# Patient Record
Sex: Female | Born: 1940
Health system: Southern US, Community
[De-identification: ages and names within clinical notes are randomized; demographics above are authoritative.]

## PROBLEM LIST (undated history)

## (undated) DIAGNOSIS — R011 Cardiac murmur, unspecified: Secondary | ICD-10-CM

## (undated) DIAGNOSIS — I4891 Unspecified atrial fibrillation: Secondary | ICD-10-CM

## (undated) DIAGNOSIS — J45909 Unspecified asthma, uncomplicated: Secondary | ICD-10-CM

## (undated) DIAGNOSIS — E7800 Pure hypercholesterolemia, unspecified: Secondary | ICD-10-CM

## (undated) DIAGNOSIS — A0472 Enterocolitis due to Clostridium difficile, not specified as recurrent: Secondary | ICD-10-CM

## (undated) DIAGNOSIS — F419 Anxiety disorder, unspecified: Secondary | ICD-10-CM

## (undated) DIAGNOSIS — M810 Age-related osteoporosis without current pathological fracture: Secondary | ICD-10-CM

## (undated) HISTORY — DX: Cardiac murmur, unspecified: R01.1

## (undated) HISTORY — DX: Age-related osteoporosis without current pathological fracture: M81.0

## (undated) HISTORY — PX: NASAL SINUS SURGERY: SHX719

## (undated) HISTORY — PX: ABDOMINAL HYSTERECTOMY: SHX81

## (undated) HISTORY — DX: Enterocolitis due to Clostridium difficile, not specified as recurrent: A04.72

---

## 2003-10-05 LAB — HM COLONOSCOPY

## 2006-08-29 ENCOUNTER — Other Ambulatory Visit: Payer: Self-pay

## 2006-08-29 ENCOUNTER — Emergency Department: Payer: Self-pay | Admitting: General Practice

## 2006-09-10 ENCOUNTER — Ambulatory Visit: Payer: Self-pay | Admitting: General Practice

## 2007-03-05 ENCOUNTER — Ambulatory Visit: Payer: Self-pay | Admitting: Otolaryngology

## 2008-08-24 ENCOUNTER — Ambulatory Visit: Payer: Self-pay | Admitting: Family Medicine

## 2008-10-20 ENCOUNTER — Inpatient Hospital Stay: Payer: Self-pay | Admitting: Internal Medicine

## 2008-10-20 ENCOUNTER — Ambulatory Visit: Payer: Self-pay | Admitting: Family Medicine

## 2009-08-25 ENCOUNTER — Ambulatory Visit: Payer: Self-pay | Admitting: Family Medicine

## 2010-09-14 ENCOUNTER — Ambulatory Visit: Payer: Self-pay | Admitting: Family Medicine

## 2010-09-28 ENCOUNTER — Ambulatory Visit: Payer: Self-pay | Admitting: Family Medicine

## 2012-01-01 ENCOUNTER — Ambulatory Visit: Payer: Self-pay | Admitting: Family Medicine

## 2012-06-30 DIAGNOSIS — I4891 Unspecified atrial fibrillation: Secondary | ICD-10-CM | POA: Insufficient documentation

## 2012-06-30 DIAGNOSIS — J45909 Unspecified asthma, uncomplicated: Secondary | ICD-10-CM | POA: Insufficient documentation

## 2012-12-19 ENCOUNTER — Ambulatory Visit: Payer: Self-pay | Admitting: Family Medicine

## 2013-01-27 ENCOUNTER — Ambulatory Visit: Payer: Self-pay | Admitting: Family Medicine

## 2014-01-11 ENCOUNTER — Ambulatory Visit: Payer: Self-pay | Admitting: Oncology

## 2014-01-18 ENCOUNTER — Ambulatory Visit: Payer: Self-pay | Admitting: Oncology

## 2014-01-18 LAB — CBC CANCER CENTER
Basophil #: 0 x10 3/mm (ref 0.0–0.1)
Basophil %: 0.4 %
EOS PCT: 1.5 %
Eosinophil #: 0.1 x10 3/mm (ref 0.0–0.7)
HCT: 40.7 % (ref 35.0–47.0)
HGB: 13.5 g/dL (ref 12.0–16.0)
Lymphocyte #: 1.5 x10 3/mm (ref 1.0–3.6)
Lymphocyte %: 39.9 %
MCH: 32.3 pg (ref 26.0–34.0)
MCHC: 33.2 g/dL (ref 32.0–36.0)
MCV: 97 fL (ref 80–100)
MONO ABS: 0.4 x10 3/mm (ref 0.2–0.9)
Monocyte %: 10.5 %
NEUTROS ABS: 1.8 x10 3/mm (ref 1.4–6.5)
Neutrophil %: 47.7 %
Platelet: 185 x10 3/mm (ref 150–440)
RBC: 4.2 10*6/uL (ref 3.80–5.20)
RDW: 13.2 % (ref 11.5–14.5)
WBC: 3.8 x10 3/mm (ref 3.6–11.0)

## 2014-01-18 LAB — LACTATE DEHYDROGENASE: LDH: 174 U/L (ref 81–246)

## 2014-02-02 ENCOUNTER — Ambulatory Visit: Payer: Self-pay | Admitting: Oncology

## 2014-02-25 DIAGNOSIS — E785 Hyperlipidemia, unspecified: Secondary | ICD-10-CM | POA: Insufficient documentation

## 2014-02-25 DIAGNOSIS — F419 Anxiety disorder, unspecified: Secondary | ICD-10-CM | POA: Insufficient documentation

## 2014-02-25 DIAGNOSIS — I491 Atrial premature depolarization: Secondary | ICD-10-CM | POA: Insufficient documentation

## 2014-03-04 ENCOUNTER — Ambulatory Visit: Payer: Self-pay | Admitting: Oncology

## 2014-03-25 ENCOUNTER — Ambulatory Visit: Payer: Self-pay | Admitting: Family Medicine

## 2014-03-25 LAB — HM MAMMOGRAPHY

## 2014-07-22 ENCOUNTER — Ambulatory Visit: Payer: Self-pay | Admitting: Oncology

## 2014-07-26 ENCOUNTER — Ambulatory Visit: Payer: Self-pay

## 2014-08-03 ENCOUNTER — Ambulatory Visit
Admit: 2014-08-03 | Disposition: A | Discharge status: Home / Self Care | Payer: Self-pay | Attending: Oncology | Admitting: Oncology

## 2014-09-03 ENCOUNTER — Ambulatory Visit
Admit: 2014-09-03 | Disposition: A | Discharge status: Home / Self Care | Payer: Self-pay | Attending: Oncology | Admitting: Oncology

## 2014-10-19 ENCOUNTER — Emergency Department: Payer: Medicare PPO

## 2014-10-19 ENCOUNTER — Emergency Department
Admission: EM | Admit: 2014-10-19 | Discharge: 2014-10-19 | Disposition: A | Discharge status: Home / Self Care | Payer: Medicare PPO | Attending: Emergency Medicine | Admitting: Emergency Medicine

## 2014-10-19 ENCOUNTER — Encounter: Payer: Self-pay | Admitting: Emergency Medicine

## 2014-10-19 ENCOUNTER — Other Ambulatory Visit: Payer: Self-pay

## 2014-10-19 DIAGNOSIS — Z7951 Long term (current) use of inhaled steroids: Secondary | ICD-10-CM | POA: Diagnosis not present

## 2014-10-19 DIAGNOSIS — Z79899 Other long term (current) drug therapy: Secondary | ICD-10-CM | POA: Insufficient documentation

## 2014-10-19 DIAGNOSIS — R002 Palpitations: Secondary | ICD-10-CM

## 2014-10-19 DIAGNOSIS — R42 Dizziness and giddiness: Secondary | ICD-10-CM | POA: Diagnosis not present

## 2014-10-19 DIAGNOSIS — Z7982 Long term (current) use of aspirin: Secondary | ICD-10-CM | POA: Diagnosis not present

## 2014-10-19 DIAGNOSIS — Z7901 Long term (current) use of anticoagulants: Secondary | ICD-10-CM | POA: Diagnosis not present

## 2014-10-19 DIAGNOSIS — I499 Cardiac arrhythmia, unspecified: Secondary | ICD-10-CM | POA: Diagnosis present

## 2014-10-19 DIAGNOSIS — I4892 Unspecified atrial flutter: Secondary | ICD-10-CM | POA: Diagnosis not present

## 2014-10-19 HISTORY — DX: Unspecified asthma, uncomplicated: J45.909

## 2014-10-19 HISTORY — DX: Anxiety disorder, unspecified: F41.9

## 2014-10-19 HISTORY — DX: Pure hypercholesterolemia, unspecified: E78.00

## 2014-10-19 HISTORY — DX: Unspecified atrial fibrillation: I48.91

## 2014-10-19 LAB — URINALYSIS COMPLETE WITH MICROSCOPIC (ARMC ONLY)
BILIRUBIN URINE: NEGATIVE
Bacteria, UA: NONE SEEN
Glucose, UA: NEGATIVE mg/dL
Hgb urine dipstick: NEGATIVE
KETONES UR: NEGATIVE mg/dL
Leukocytes, UA: NEGATIVE
Nitrite: NEGATIVE
PH: 7 (ref 5.0–8.0)
Protein, ur: NEGATIVE mg/dL
RBC / HPF: NONE SEEN RBC/hpf (ref 0–5)
Specific Gravity, Urine: 1.003 — ABNORMAL LOW (ref 1.005–1.030)

## 2014-10-19 LAB — BASIC METABOLIC PANEL
Anion gap: 8 (ref 5–15)
BUN: 12 mg/dL (ref 6–20)
CALCIUM: 9.5 mg/dL (ref 8.9–10.3)
CO2: 29 mmol/L (ref 22–32)
Chloride: 103 mmol/L (ref 101–111)
Creatinine, Ser: 0.67 mg/dL (ref 0.44–1.00)
GFR calc non Af Amer: >60 mL/min (ref >60)
Glucose, Bld: 86 mg/dL (ref 65–99)
POTASSIUM: 3.9 mmol/L (ref 3.5–5.1)
Sodium: 140 mmol/L (ref 135–145)

## 2014-10-19 LAB — CBC
HEMATOCRIT: 42.4 % (ref 35.0–47.0)
Hemoglobin: 14.3 g/dL (ref 12.0–16.0)
MCH: 32.3 pg (ref 26.0–34.0)
MCHC: 33.7 g/dL (ref 32.0–36.0)
MCV: 95.8 fL (ref 80.0–100.0)
Platelets: 174 10*3/uL (ref 150–440)
RBC: 4.43 MIL/uL (ref 3.80–5.20)
RDW: 13.2 % (ref 11.5–14.5)
WBC: 4 10*3/uL (ref 3.6–11.0)

## 2014-10-19 LAB — TROPONIN I

## 2014-10-19 MED ORDER — METOPROLOL TARTRATE 1 MG/ML IV SOLN
2.5000 mg | Freq: Once | INTRAVENOUS | Status: AC
  Administered 2014-10-19: 2.5 mg via INTRAVENOUS

## 2014-10-19 MED ORDER — METOPROLOL TARTRATE 25 MG PO TABS
ORAL_TABLET | ORAL | Status: AC
  Administered 2014-10-19: 25 mg via ORAL
  Filled 2014-10-19: qty 1

## 2014-10-19 MED ORDER — METOPROLOL TARTRATE 25 MG PO TABS
25.0000 mg | ORAL_TABLET | Freq: Once | ORAL | Status: AC
  Administered 2014-10-19: 25 mg via ORAL

## 2014-10-19 MED ORDER — RIVAROXABAN (XARELTO) VTE STARTER PACK (15 & 20 MG): ORAL_TABLET | ORAL | Status: DC

## 2014-10-19 MED ORDER — METOPROLOL TARTRATE 1 MG/ML IV SOLN
INTRAVENOUS | Status: AC
  Filled 2014-10-19: qty 5

## 2014-10-19 NOTE — Discharge Instructions (Signed)
Take Xarelto for anticoagulation. Take metoprolol twice a day for rate control. Follow-up with Dr. Darrold JunkerParaschos. Return to the emergency department if you have any chest pain, weakness, or other urgent concerns.  Atrial Flutter Atrial flutter is a heart rhythm that can cause the heart to beat very fast (tachycardia). It originates in the upper chambers of the heart (atria). In atrial flutter, the top chambers of the heart (atria) often beat much faster than the bottom chambers of the heart (ventricles). Atrial flutter has a regular "saw toothed" appearance in an EKG readout. An EKG is a test that records the electrical activity of the heart. Atrial flutter can cause the heart to beat up to 150 beats per minute (BPM). Atrial flutter can either be short lived (paroxysmal) or permanent.  CAUSES  Causes of atrial flutter can be many. Some of these include:  Heart related issues:  Heart attack (myocardial infarction).  Heart failure.  Heart valve problems.  Poorly controlled high blood pressure (hypertension).  After open heart surgery.  Lung related issues:  A blood clot in the lungs (pulmonary embolism).  Chronic obstructive pulmonary disease (COPD). Medications used to treat COPD can attribute to atrial flutter.  Other related causes:  Hyperthyroidism.  Caffeine.  Some decongestant cold medications.  Low electrolyte levels such as potassium or magnesium.  Cocaine. SYMPTOMS  An awareness of your heart beating rapidly (palpitations).  Shortness of breath.  Chest pain.  Low blood pressure (hypotension).  Dizziness or fainting. DIAGNOSIS  Different tests can be performed to diagnose atrial flutter.   An EKG.  Holter monitor. This is a 24-hour recording of your heart rhythm. You will also be given a diary. Write down all symptoms that you have and what you were doing at the time you experienced symptoms.  Cardiac event monitor. This small device can be worn for up to 30  days. When you have heart symptoms, you will push a button on the device. This will then record your heart rhythm.  Echocardiogram. This is an imaging test to look at your heart. Your caregiver will look at your heart valves and the ventricles.  Stress test. This test can help determine if the atrial flutter is related to exercise or if coronary artery disease is present.  Laboratory studies will look at certain blood levels like:  Complete blood count (CBC).  Potassium.  Magnesium.  Thyroid function. TREATMENT  Treatment of atrial flutter varies. A combination of therapies may be used or sometimes atrial flutter may need only 1 type of treatment.  Lab work: If your blood work, such as your electrolytes (potassium, magnesium) or your thyroid function tests, are abnormal, your caregiver will treat them accordingly.  Medication:  There are several different types of medications that can convert your heart to a normal rhythm and prevent atrial flutter from reoccurring.  Nonsurgical procedures: Nonsurgical techniques may be used to control atrial flutter. Some examples include:  Cardioversion. This technique uses either drugs or an electrical shock to restore a normal heart rhythm:  Cardioversion drugs may be given through an intravenous (IV) line to help "reset" the heart rhythm.  In electrical cardioversion, your caregiver shocks your heart with electrical energy. This helps to reset the heartbeat to a normal rhythm.  Ablation. If atrial flutter is a persistent problem, an ablation may be needed. This procedure is done under mild sedation. High frequency radio-wave energy is used to destroy the area of heart tissue responsible for atrial flutter. SEEK IMMEDIATE MEDICAL CARE IF:  You have:  Dizziness.  Near fainting or fainting.  Shortness of breath.  Chest pain or pressure.  Sudden nausea or vomiting.  Profuse sweating. If you have the above symptoms, call your local  emergency service immediately! Do not drive yourself to the hospital. MAKE SURE YOU:   Understand these instructions.  Will watch your condition.  Will get help right away if you are not doing well or get worse. Document Released: 10/07/2008 Document Revised: 10/05/2013 Document Reviewed: 10/07/2008 [facility] Patient Information 2015 [place], [place]. This information is not intended to replace advice given to you by your health care provider. Make sure you discuss any questions you have with your health care provider.

## 2014-10-19 NOTE — ED Notes (Signed)
Patient to ED via POV with c/o feeling like heart has been racing off and on all night. Patient reports that these feelings would stop and then she would feel her heart rate slow until it almost stopped and then start back. Patient reports that she is not having feeling at this time. Patient reports history of same and that she was placed on Metoprolol about a month ago by cardiology but was unable to take.

## 2014-10-19 NOTE — ED Provider Notes (Signed)
[facility] Emergency Department Provider Note  ____________________________________________  Time seen: 26950  I have reviewed the triage vital signs and the nursing notes.   HISTORY  Chief Complaint Irregular Heart Beat  vertigo, dizziness, headache    HPI Alisha Hernandez is a 74 y.o. female with a history of atrial fibrillation and irregular heart rate. She is seen by Dr. Beola CordParra shows for this. She was placed on metoprolol about 6-7 weeks ago. She reports that the metoprolol caused her to feel more dizzy. She did not measure blood pressure. It's unclear whether she was hypotensive or if she has dizziness from another problem. She stopped the metoprolol and continues to have the sense of dizziness and vertigo. This leads her to feel like she is going to bump into the wall or walk into a door.  While she has continued to have some sense of palpitations intermittently over the past 2-3 weeks since she stopped the metoprolol, she says that this sensation is worse beginning yesterday. Due to the faster heart rate and palpitations she decided to come to the emergency department. In route to the emergency department she said she had a severe headache that lasted just a short while. It has improved some now. She does have intermittent headaches.  She denies chest pain, shortness of breath, any focal weakness, or nausea and vomiting.     Past Medical History  Diagnosis Date  . Atrial fibrillation   . Asthma   . Hypercholesteremia   . Anxiety     There are no active problems to display for this patient.   No past surgical history on file.  Current Outpatient Rx  Name  Route  Sig  Dispense  Refill  . ALPRAZolam (XANAX) 0.25 MG tablet   Oral   Take 0.25 mg by mouth as needed.         Marland Kitchen. aspirin EC 81 MG tablet   Oral   Take 1 tablet by mouth daily.         . cetirizine (ZYRTEC) 10 MG tablet   Oral   Take 1 tablet by mouth daily.         .  cholecalciferol (VITAMIN D) 1000 UNITS tablet   Oral   Take 1,000 Units by mouth daily.         Marland Kitchen. co-enzyme Q-10 30 MG capsule   Oral   Take 30 mg by mouth daily.         . Fluticasone-Salmeterol (ADVAIR) 250-50 MCG/DOSE AEPB   Inhalation   Inhale 1 puff into the lungs 2 (two) times daily.         Marland Kitchen. levalbuterol (XOPENEX HFA) 45 MCG/ACT inhaler   Inhalation   Inhale 2 puffs into the lungs as needed.         . nitroGLYCERIN (NITROSTAT) 0.4 MG SL tablet   Sublingual   Place 1 tablet under the tongue every 5 (five) minutes as needed. For chest pain         . omega-3 acid ethyl esters (LOVAZA) 1 G capsule   Oral   Take 1 g by mouth daily.         . predniSONE (DELTASONE) 5 MG tablet   Oral   Take 1 tablet by mouth as needed.         . sertraline (ZOLOFT) 50 MG tablet   Oral   Take 1 tablet by mouth daily.         . simvastatin (ZOCOR) 20  MG tablet   Oral   Take 20 mg by mouth daily.         . Rivaroxaban (XARELTO STARTER PACK) 15 & 20 MG TBPK      Take as directed on package: Start with one 15mg  tablet by mouth twice a day with food. On Day 22, switch to one 20mg  tablet once a day with food.   51 each   0     Allergies Levofloxacin  No family history on file.  Social History History  Substance Use Topics  . Smoking status: Never Smoker   . Smokeless tobacco: Not on file  . Alcohol Use: No    Review of Systems  Constitutional: Negative for fever. ENT: Negative for sore throat. Cardiovascular: Negative for chest pain. Positive for palpitations. See history of present illness.  Respiratory: Negative for shortness of breath. Gastrointestinal: Negative for abdominal pain, vomiting and diarrhea. Genitourinary: Negative for dysuria. Musculoskeletal: Negative for back pain. Skin: Negative for rash. Neurological: Negative for headaches   10-point ROS otherwise negative.  ____________________________________________   PHYSICAL  EXAM:  VITAL SIGNS: ED Triage Vitals  Enc Vitals Group     BP 10/19/14 0949 142/86 mmHg     Pulse Rate 10/19/14 0949 121     Resp 10/19/14 0949 20     Temp 10/19/14 0949 97.6 F (36.4 C)     Temp Source 10/19/14 0949 Oral     SpO2 10/19/14 0950 98 %     Weight 10/19/14 0950 147 lb 11.2 oz (66.996 kg)     Height 10/19/14 0950 5\' 6"  (1.676 m)     Head Cir --      Peak Flow --      Pain Score --      Pain Loc --      Pain Edu? --      Excl. in GC? --     Constitutional: Alert and oriented. Overall well-appearing and communicative, slightly anxious with her elevated heart rate at 120. ENT   Head: Normocephalic and atraumatic.   Nose: No congestion/rhinnorhea.   Mouth/Throat: Mucous membranes are moist. Cardiovascular: Regular rhythm with tachycardia 120. Respiratory: Normal respiratory effort without tachypnea. Breath sounds are clear and equal bilaterally. No wheezes/rales/rhonchi. Gastrointestinal: Soft and nontender. No distention.  Back: There is no CVA tenderness. Musculoskeletal: Nontender with normal range of motion in all extremities.  No noted edema. Neurologic:  Normal speech and language. No gross focal neurologic deficits are appreciated. 5 over 5 strength in all 4 extremities. Negative pronator drift, negative finger to nose coordination.  Skin:  Skin is warm, dry. No rash noted. Psychiatric: Mood and affect are normal. Speech and behavior are normal.  ____________________________________________    LABS (pertinent positives/negatives)  CBC and metabolic panel within normal limits. Troponin is negative.  ____________________________________________   EKG  EKG at 9:50 AM shows atrial flutter with a 2-1 conduction.  ____________________________________________    RADIOLOGY  CT of head shows no acute intracranial abnormality. She does have chronic left sphenoid  sinusitis.  ____________________________________________   PROCEDURES  Procedure(s) performed: None  Critical Care performed: No  ____________________________________________   INITIAL IMPRESSION / ASSESSMENT AND PLAN / ED COURSE  The patient's with atrial flutter. We have treated patient with metoprolol 2.5 mg IV.  ____________________________________________   FINAL CLINICAL IMPRESSION(S) / ED DIAGNOSES  Final diagnoses:  Atrial flutter, unspecified  Palpitations      Darien Ramusavid W Novak Stgermaine, MD 10/19/14 1301

## 2014-10-19 NOTE — ED Notes (Signed)
MD at bedside. 

## 2015-02-18 ENCOUNTER — Ambulatory Visit: Payer: Medicare PPO | Admitting: Family Medicine

## 2015-02-18 DIAGNOSIS — M810 Age-related osteoporosis without current pathological fracture: Secondary | ICD-10-CM | POA: Insufficient documentation

## 2015-02-18 DIAGNOSIS — A0472 Enterocolitis due to Clostridium difficile, not specified as recurrent: Secondary | ICD-10-CM | POA: Insufficient documentation

## 2015-02-18 DIAGNOSIS — J45909 Unspecified asthma, uncomplicated: Secondary | ICD-10-CM | POA: Insufficient documentation

## 2015-02-28 ENCOUNTER — Ambulatory Visit: Payer: Self-pay | Admitting: Family Medicine

## 2015-03-12 ENCOUNTER — Other Ambulatory Visit: Payer: Self-pay | Admitting: Family Medicine

## 2015-03-12 NOTE — Telephone Encounter (Signed)
rx approved

## 2015-03-12 NOTE — Telephone Encounter (Signed)
Will need labs soone ALT was 31 in Feb 2016 (reviewed in [employer]/textile technologist) Rx approved x 1

## 2015-03-14 ENCOUNTER — Telehealth: Payer: Self-pay | Admitting: Family Medicine

## 2015-03-14 NOTE — Telephone Encounter (Signed)
Patient called wanting to talk to Dr. Sherie Don, she ask if she can call her. Patient did not state what it was just that it was personal and needed to talk to her.

## 2015-03-14 NOTE — Telephone Encounter (Signed)
Pt called back and said she no longer needed to speak with Dr Sherie Don

## 2015-03-16 ENCOUNTER — Ambulatory Visit (INDEPENDENT_AMBULATORY_CARE_PROVIDER_SITE_OTHER): Payer: Medicare PPO | Admitting: Family Medicine

## 2015-03-16 ENCOUNTER — Encounter: Payer: Self-pay | Admitting: Family Medicine

## 2015-03-16 VITALS — BP 143/82 | HR 59 | Temp 97.9°F | Ht 65.0 in | Wt 142.0 lb

## 2015-03-16 DIAGNOSIS — I491 Atrial premature depolarization: Secondary | ICD-10-CM | POA: Diagnosis not present

## 2015-03-16 DIAGNOSIS — Z1239 Encounter for other screening for malignant neoplasm of breast: Secondary | ICD-10-CM

## 2015-03-16 DIAGNOSIS — Z5181 Encounter for therapeutic drug level monitoring: Secondary | ICD-10-CM | POA: Insufficient documentation

## 2015-03-16 DIAGNOSIS — J4530 Mild persistent asthma, uncomplicated: Secondary | ICD-10-CM | POA: Diagnosis not present

## 2015-03-16 DIAGNOSIS — R292 Abnormal reflex: Secondary | ICD-10-CM | POA: Diagnosis not present

## 2015-03-16 DIAGNOSIS — I480 Paroxysmal atrial fibrillation: Secondary | ICD-10-CM

## 2015-03-16 DIAGNOSIS — E785 Hyperlipidemia, unspecified: Secondary | ICD-10-CM

## 2015-03-16 DIAGNOSIS — R42 Dizziness and giddiness: Secondary | ICD-10-CM

## 2015-03-16 NOTE — Patient Instructions (Addendum)
Do not put yourself in any situation where you might fall or suffer a fracture (do not get up on a chair or ladder, for example) Do not drive if you feel woozy or weak or dizzy I'll refer you to an ear nose throat doctor to see if inner ear could be the cause of your symptoms We'll get some labs today to see if there is an issue with your electrolytes or thyroid or if you have anemia

## 2015-03-16 NOTE — Assessment & Plan Note (Signed)
Check Mg2+, K+, TSH

## 2015-03-16 NOTE — Assessment & Plan Note (Signed)
Increases risk of stroke, but no neurologic findings; on xarelto for stroke prevention; discussed signs/symptoms, reasons to call 911
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
## 2015-03-16 NOTE — Assessment & Plan Note (Signed)
Check fasting lipids; monitor SGPT on medicine; avoid saturated fats

## 2015-03-16 NOTE — Assessment & Plan Note (Signed)
Encouraged patient to use the inhaler regularly

## 2015-03-17 LAB — CBC WITH DIFFERENTIAL/PLATELET
BASOS: 0 %
Basophils Absolute: 0 10*3/uL (ref 0.0–0.2)
EOS (ABSOLUTE): 0.1 10*3/uL (ref 0.0–0.4)
EOS: 2 %
HEMATOCRIT: 43.1 % (ref 34.0–46.6)
HEMOGLOBIN: 15.2 g/dL (ref 11.1–15.9)
Immature Grans (Abs): 0 10*3/uL (ref 0.0–0.1)
Immature Granulocytes: 0 %
LYMPHS ABS: 2 10*3/uL (ref 0.7–3.1)
Lymphs: 49 %
MCH: 32.6 pg (ref 26.6–33.0)
MCHC: 35.3 g/dL (ref 31.5–35.7)
MCV: 93 fL (ref 79–97)
MONOCYTES: 9 %
Monocytes Absolute: 0.4 10*3/uL (ref 0.1–0.9)
NEUTROS ABS: 1.6 10*3/uL (ref 1.4–7.0)
Neutrophils: 40 %
Platelets: 224 10*3/uL (ref 150–379)
RBC: 4.66 x10E6/uL (ref 3.77–5.28)
RDW: 13.1 % (ref 12.3–15.4)
WBC: 4.1 10*3/uL (ref 3.4–10.8)

## 2015-03-17 LAB — TSH: TSH: 1.72 u[IU]/mL (ref 0.450–4.500)

## 2015-03-17 LAB — LIPID PANEL W/O CHOL/HDL RATIO
CHOLESTEROL TOTAL: 199 mg/dL (ref 100–199)
HDL: 79 mg/dL (ref >39)
LDL Calculated: 101 mg/dL — ABNORMAL HIGH (ref 0–99)
TRIGLYCERIDES: 94 mg/dL (ref 0–149)
VLDL Cholesterol Cal: 19 mg/dL (ref 5–40)

## 2015-03-17 LAB — COMPREHENSIVE METABOLIC PANEL
ALBUMIN: 5.1 g/dL — AB (ref 3.5–4.8)
ALT: 24 IU/L (ref 0–32)
AST: 27 IU/L (ref 0–40)
Albumin/Globulin Ratio: 1.8 (ref 1.1–2.5)
Alkaline Phosphatase: 66 IU/L (ref 39–117)
BUN / CREAT RATIO: 21 (ref 11–26)
BUN: 15 mg/dL (ref 8–27)
Bilirubin Total: 0.5 mg/dL (ref 0.0–1.2)
CO2: 27 mmol/L (ref 18–29)
CREATININE: 0.72 mg/dL (ref 0.57–1.00)
Calcium: 10.1 mg/dL (ref 8.7–10.3)
Chloride: 98 mmol/L (ref 97–108)
GFR calc Af Amer: 95 mL/min/{1.73_m2} (ref >59)
GFR calc non Af Amer: 82 mL/min/{1.73_m2} (ref >59)
GLOBULIN, TOTAL: 2.9 g/dL (ref 1.5–4.5)
Glucose: 91 mg/dL (ref 65–99)
Potassium: 4.4 mmol/L (ref 3.5–5.2)
Sodium: 141 mmol/L (ref 134–144)
Total Protein: 8 g/dL (ref 6.0–8.5)

## 2015-03-17 LAB — T4, FREE: Free T4: 1.25 ng/dL (ref 0.82–1.77)

## 2015-03-17 LAB — MAGNESIUM: Magnesium: 2.2 mg/dL (ref 1.6–2.3)

## 2015-03-18 ENCOUNTER — Telehealth: Payer: Self-pay | Admitting: Family Medicine

## 2015-03-18 DIAGNOSIS — R292 Abnormal reflex: Secondary | ICD-10-CM

## 2015-03-18 DIAGNOSIS — E8809 Other disorders of plasma-protein metabolism, not elsewhere classified: Secondary | ICD-10-CM | POA: Insufficient documentation

## 2015-03-18 NOTE — Assessment & Plan Note (Signed)
Normal TSH and free T4; refer to neurologist

## 2015-03-18 NOTE — Telephone Encounter (Signed)
Please let the patient know that her tests overall look very good Her thyroid tests were normal, so I'm going to suggest we have her see a neurologist about the abnormal reflexes in her legs; they may decide to get MRI pictures of her spine, but I don't want to just order those on my own without narrowing it down (neck or mid back or lower back); if I ordered her whole spine, she'd be in the MRI scanner a long time; we'll have them just an office visit and exam with the neurologist to see what testing might be helpful, if needed at all Her albumin (a type of protein) is up just three-tenths of a point, not a big worry; I just want to recheck that in a month, have her come in well-hydrated; she does not need to change her diet All of her other labs look great

## 2015-03-18 NOTE — Telephone Encounter (Signed)
Patient notified, she wants to wait on Neuro referral for right now since the hyperreflexia is not bothering her. She will call us if she decides to go to Neuro.

## 2015-03-18 NOTE — Telephone Encounter (Signed)
Pt anxious to get her lab results.

## 2015-03-23 ENCOUNTER — Other Ambulatory Visit: Payer: Self-pay | Admitting: Unknown Physician Specialty

## 2015-03-23 DIAGNOSIS — R911 Solitary pulmonary nodule: Secondary | ICD-10-CM

## 2015-03-24 ENCOUNTER — Encounter: Payer: Self-pay | Admitting: Family Medicine

## 2015-03-25 ENCOUNTER — Ambulatory Visit: Payer: Medicare PPO | Admitting: Family Medicine

## 2015-03-28 ENCOUNTER — Telehealth: Payer: Self-pay

## 2015-03-28 NOTE — Telephone Encounter (Signed)
She has been thinking about the neurology appt and wants to wait on going there right now unless you think it's urgent that she goes.

## 2015-04-14 ENCOUNTER — Ambulatory Visit: Admission: RE | Admit: 2015-04-14 | Payer: Medicare PPO | Source: Ambulatory Visit

## 2015-04-18 ENCOUNTER — Ambulatory Visit (INDEPENDENT_AMBULATORY_CARE_PROVIDER_SITE_OTHER): Payer: Medicare PPO | Admitting: Family Medicine

## 2015-04-18 ENCOUNTER — Telehealth: Payer: Self-pay

## 2015-04-18 ENCOUNTER — Encounter: Payer: Self-pay | Admitting: Family Medicine

## 2015-04-18 ENCOUNTER — Telehealth: Payer: Self-pay | Admitting: Family Medicine

## 2015-04-18 VITALS — BP 157/78 | HR 57 | Temp 97.2°F | Wt 146.0 lb

## 2015-04-18 DIAGNOSIS — R197 Diarrhea, unspecified: Secondary | ICD-10-CM | POA: Insufficient documentation

## 2015-04-18 DIAGNOSIS — R531 Weakness: Secondary | ICD-10-CM

## 2015-04-18 DIAGNOSIS — Z7901 Long term (current) use of anticoagulants: Secondary | ICD-10-CM | POA: Diagnosis not present

## 2015-04-18 DIAGNOSIS — E8809 Other disorders of plasma-protein metabolism, not elsewhere classified: Secondary | ICD-10-CM

## 2015-04-18 DIAGNOSIS — A047 Enterocolitis due to Clostridium difficile: Secondary | ICD-10-CM | POA: Diagnosis not present

## 2015-04-18 DIAGNOSIS — A0472 Enterocolitis due to Clostridium difficile, not specified as recurrent: Secondary | ICD-10-CM

## 2015-04-18 DIAGNOSIS — R195 Other fecal abnormalities: Secondary | ICD-10-CM

## 2015-04-18 DIAGNOSIS — R5383 Other fatigue: Secondary | ICD-10-CM | POA: Insufficient documentation

## 2015-04-18 LAB — CBC WITH DIFFERENTIAL/PLATELET
HEMOGLOBIN: 14.6 g/dL (ref 11.1–15.9)
Hematocrit: 41.3 % (ref 34.0–46.6)
LYMPHS ABS: 2.6 10*3/uL (ref 0.7–3.1)
Lymphs: 45 %
MCH: 33.5 pg — ABNORMAL HIGH (ref 26.6–33.0)
MCHC: 35.4 g/dL (ref 31.5–35.7)
MCV: 95 fL (ref 79–97)
MID (ABSOLUTE): 0.8 10*3/uL (ref 0.1–1.6)
MID: 14 %
NEUTROS ABS: 2.4 10*3/uL (ref 1.4–7.0)
Neutrophils: 42 %
Platelets: 219 10*3/uL (ref 150–379)
RBC: 4.36 x10E6/uL (ref 3.77–5.28)
RDW: 13.3 % (ref 12.3–15.4)
WBC: 5.8 10*3/uL (ref 3.4–10.8)

## 2015-04-18 NOTE — Telephone Encounter (Signed)
Pt has had loose black stool since last Wed. She went to ENT last Monday and was given an antibiotic but stopped taking it on Friday because she thought that was why she was having the black stool. Would like a call back for advice.

## 2015-04-18 NOTE — Telephone Encounter (Signed)
-----   Message from Kerman PasseyMelinda P Lada, MD sent at 04/18/2015  3:02 PM EST ----- Regarding: Okay to continue Xarelto Please call patient and let her know that I spoke with Dr. Darrold JunkerParaschos He says it is okay to continue her blood thinner Take her Xarelto tonight as usual If more bleeding or lots of black stool, go to the ER

## 2015-04-18 NOTE — Patient Instructions (Signed)
If you get worse, then go to the emergency department Let's do the testing for C diff Start taking probiotics or take yogurt that has active live cultures like Activia Do not take another Xarelto until you have spoken with me or Dr. Darrold JunkerParaschos; if you have not heard from either of us by 4:00 pm, please call me We'll contact you at [phone], 2nd option is [phone]

## 2015-04-18 NOTE — Progress Notes (Signed)
BP 157/78 mmHg  Pulse 57  Temp(Src) 97.2 F (36.2 C)  Wt 146 lb (66.225 kg)  SpO2 98%   Subjective:    Patient ID: Alisha Hernandez, female    DOB: 05-27-1941, 74 y.o.   MRN: [id number]  HPI: Alisha Hernandez is a 74 y.o. female  Chief Complaint  Patient presents with  . Black Stools    She started Cefdinir on Monday and Wednesday she started having black stools. She stopped taking it on Friday but her symptoms have not improved. She is also on Xarelto which scares her. She has her stool is inbetween diarrhea and regular stool, she also states when she pees she poops.    She is taking cefdinir 300 mg and prednisone -- aactually she stopped the cefdinir a few days ago; these were started for URI / asthma flare Every time she pees, she poops; her stool is only half-formed; no orders whatsoever She quit taking cefdinir on Friday; started 11/7 and stopped on 11/11; feeling better from bronchitis standpoint Takes Xarelto also for atrial fibrillation; on Thursday night she had bad chills; got up to run to the bathroom on Friday; this woke her up to go to the bathroom; not regular diarrhea; she has had C diff about 7 years ago; this stool doesn't have a foul odor; no abdominal pain, but feels bad from going to the bathroom; she does feel weak; no pain at the rectum, just getting a little irritated  Relevant past medical, surgical, family and social history reviewed and updated as indicated. Interim medical history since our last visit reviewed. Allergies and medications reviewed and updated.  Review of Systems Per HPI unless specifically indicated above      Objective:    BP 157/78 mmHg  Pulse 57  Temp(Src) 97.2 F (36.2 C)  Wt 146 lb (66.225 kg)  SpO2 98%  Wt Readings from Last 3 Encounters:  04/18/15 146 lb (66.225 kg)  03/16/15 142 lb (64.411 kg)  02/18/15 145 lb (65.772 kg)    Physical Exam  Constitutional: She appears well-developed and well-nourished. No distress.  HENT:   Head: Normocephalic and atraumatic.  Eyes: EOM are normal. No scleral icterus.  Neck: No thyromegaly present.  Cardiovascular: Normal rate, regular rhythm and normal heart sounds.   Pulmonary/Chest: Effort normal and breath sounds normal. No respiratory distress. She has no wheezes.  Abdominal: Soft. Bowel sounds are normal. She exhibits no distension. There is no hepatosplenomegaly. There is no tenderness. There is no guarding.  Genitourinary: Rectal exam shows no external hemorrhoid, no internal hemorrhoid, no fissure, no mass, no tenderness and anal tone normal. Guaiac positive stool.  Musculoskeletal: Normal range of motion. She exhibits no edema.  Neurological: She is alert. She exhibits normal muscle tone.  Skin: Skin is warm and dry. She is not diaphoretic. No pallor.  Psychiatric: She has a normal mood and affect. Her behavior is normal. Judgment and thought content normal.   In-house CBC done: WBC 5.8k, Hgb 14.6, Hct 41.3, platelets 219     Assessment & Plan:   Problem List Items Addressed This Visit      Digestive   Pseudomembranous colitis    Hx of colitis in the past, puts her at risk for subsequent involvement so we'll check stool        Other   Hyperalbuminemia   Weakness    Not acutely tachycardic, not anemic; no hypotensive; she thinks it may be related to recent upper respiratory issues that caused  the need for the prednisone and antibiotic in the first place      Relevant Orders   CBC With Differential/Platelet (Completed)   Anticoagulant long-term use    Stool from gloved finger on DRE is guaiac positive, but she appears hemodynamically stable; possible more irritation from frequent wiping or perhaps colitis, but I don't suspect a primary GI bleed as the cause of her stool guaiac positivity; will discuss with cardiologist; I called and left message; she was instructed that we will contact her by the end of the day today in regards to what to do with her Xarelto  tonight after I talk to him      Relevant Orders   CBC With Differential/Platelet (Completed)   Diarrhea - Primary    ddx includes C diff colitis, gastroenteritis, as she has recently had antibiotics (she stopped them on her own) and has been in doctor's office; supportive care; reasons to seek medical care immediately reviewed      Relevant Orders   Stool C-Diff Toxin Assay (Completed)   Stool guaiac positive    Discussed with patient; called patient's cardiologist to talk about case; she is hemodynamically stable; not tachycardic, normal H/H; risk of stroke if stopping the blood thinner; will have her get C diff, see GI; reasons to seek medical attention immediately reviewed         Follow up plan: Return if symptoms worsen or fail to improve.  An after-visit summary was printed and given to the patient at check-out.  Please see the patient instructions which may contain other information and recommendations beyond what is mentioned above in the assessment and plan.  Orders Placed This Encounter  Procedures  . Stool C-Diff Toxin Assay  . CBC With Differential/Platelet

## 2015-04-18 NOTE — Telephone Encounter (Signed)
I see she is on the schedule today for 10 am

## 2015-04-18 NOTE — Telephone Encounter (Signed)
Dr. Sherie DonLada please advise.

## 2015-04-18 NOTE — Telephone Encounter (Signed)
Patient notified

## 2015-04-19 ENCOUNTER — Telehealth: Payer: Self-pay | Admitting: Family Medicine

## 2015-04-19 DIAGNOSIS — R197 Diarrhea, unspecified: Secondary | ICD-10-CM

## 2015-04-19 DIAGNOSIS — R195 Other fecal abnormalities: Secondary | ICD-10-CM | POA: Insufficient documentation

## 2015-04-19 LAB — COMPREHENSIVE METABOLIC PANEL
ALBUMIN: 4.2 g/dL (ref 3.5–4.8)
ALT: 22 IU/L (ref 0–32)
AST: 17 IU/L (ref 0–40)
Albumin/Globulin Ratio: 1.6 (ref 1.1–2.5)
Alkaline Phosphatase: 61 IU/L (ref 39–117)
BILIRUBIN TOTAL: 0.4 mg/dL (ref 0.0–1.2)
BUN / CREAT RATIO: 18 (ref 11–26)
BUN: 13 mg/dL (ref 8–27)
CALCIUM: 9.8 mg/dL (ref 8.7–10.3)
CHLORIDE: 97 mmol/L (ref 97–106)
CO2: 27 mmol/L (ref 18–29)
CREATININE: 0.73 mg/dL (ref 0.57–1.00)
GFR, EST AFRICAN AMERICAN: 93 mL/min/{1.73_m2} (ref >59)
GFR, EST NON AFRICAN AMERICAN: 81 mL/min/{1.73_m2} (ref >59)
GLUCOSE: 83 mg/dL (ref 65–99)
Globulin, Total: 2.6 g/dL (ref 1.5–4.5)
Potassium: 4.7 mmol/L (ref 3.5–5.2)
Sodium: 139 mmol/L (ref 136–144)
TOTAL PROTEIN: 6.8 g/dL (ref 6.0–8.5)

## 2015-04-19 MED ORDER — METRONIDAZOLE 500 MG PO TABS: 500.0000 mg | ORAL_TABLET | Freq: Three times a day (TID) | ORAL | Status: DC

## 2015-04-19 NOTE — Telephone Encounter (Signed)
Called and spoke to patient. I let her know that we were still waiting on C. Diff results. Patient states she just does not feel good and that she feels weak. Patient states stools still look dark and "strange looking". States she has only went to the bathroom 3 times today which is less than what she has been going. I told Dr. Sherie DonLada this information and she wanted me to call the patient back so she could speak to her. So I am routing this back to Dr. Sherie DonLada so she can document her phone conversation with patient.

## 2015-04-19 NOTE — Telephone Encounter (Signed)
I spoke with patient last night; she appreciated the staff calling earlier to let her know Dr. Darrold JunkerParaschos' recommendation to continue the blood thinner I called lab and they do not have the C diff result yet, but it is in process I tried to call the patient to talk with her about her symptoms, however, the number was busy [facility] staff --> please call patient and let her know that we're still waiting on the C. diff results; see how she's feeling

## 2015-04-19 NOTE — Telephone Encounter (Signed)
I spoke with patient; she is going much less than before, only 3x now; no foul odor; no abdominal pain; wonders if the weakness is her asthma and breathing issues; we discussed options; will start metronidazole since no results yet, which we can always stop once those are back; she agrees and will have her husband pick those up; reasons to go to ER reviewed; will refer to GI for consideration of scope in case other type of colitis going on; she sees Dr. Servando SnareWohl

## 2015-04-19 NOTE — Assessment & Plan Note (Signed)
Guaiac posiitive; C diff test pending; refer to GI

## 2015-04-20 ENCOUNTER — Ambulatory Visit: Payer: Medicare PPO | Admitting: Family Medicine

## 2015-04-20 ENCOUNTER — Telehealth: Payer: Self-pay | Admitting: Family Medicine

## 2015-04-20 DIAGNOSIS — R197 Diarrhea, unspecified: Secondary | ICD-10-CM

## 2015-04-20 LAB — CLOSTRIDIUM DIFFICILE EIA: C DIFFICILE TOXINS A+ B, EIA: NEGATIVE

## 2015-04-20 NOTE — Telephone Encounter (Signed)
Pt would like to know c diff results were in yet.

## 2015-04-20 NOTE — Telephone Encounter (Signed)
No answer

## 2015-04-20 NOTE — Telephone Encounter (Signed)
Routing to provider, wants results.

## 2015-04-20 NOTE — Telephone Encounter (Signed)
Let her know that the C diff test came back negative If her symptoms are persisting, let's get other stool studies (O&P, culture) and make sure she gets in to see Dr. Servando SnareWohl ASAP If symptoms significantly better WITH the antibiotics, let's continue for now awaiting the other stool tests

## 2015-04-20 NOTE — Assessment & Plan Note (Signed)
c diff negative; check other labs

## 2015-04-20 NOTE — Telephone Encounter (Signed)
Patient stopped by for labs. She states she is getting better every day. She did not get the antibiotic because she was afraid she'd get sicker with it. I advised her as long as she keeps improving, no need to do additional labs but do keep GI appt. If she gets worse, to let us know ASAP and we'll get the other stool tests done.

## 2015-04-22 ENCOUNTER — Ambulatory Visit: Payer: Medicare PPO

## 2015-04-23 NOTE — Assessment & Plan Note (Signed)
Not acutely tachycardic, not anemic; no hypotensive; she thinks it may be related to recent upper respiratory issues that caused the need for the prednisone and antibiotic in the first place

## 2015-04-23 NOTE — Assessment & Plan Note (Signed)
Hx of colitis in the past, puts her at risk for subsequent involvement so we'll check stool

## 2015-04-23 NOTE — Assessment & Plan Note (Signed)
Stool from gloved finger on DRE is guaiac positive, but she appears hemodynamically stable; possible more irritation from frequent wiping or perhaps colitis, but I don't suspect a primary GI bleed as the cause of her stool guaiac positivity; will discuss with cardiologist; I called and left message; she was instructed that we will contact her by the end of the day today in regards to what to do with her Xarelto tonight after I talk to him

## 2015-04-23 NOTE — Assessment & Plan Note (Signed)
ddx includes C diff colitis, gastroenteritis, as she has recently had antibiotics (she stopped them on her own) and has been in doctor's office; supportive care; reasons to seek medical care immediately reviewed

## 2015-04-23 NOTE — Assessment & Plan Note (Signed)
Discussed with patient; called patient's cardiologist to talk about case; she is hemodynamically stable; not tachycardic, normal H/H; risk of stroke if stopping the blood thinner; will have her get C diff, see GI; reasons to seek medical attention immediately reviewed

## 2015-04-26 ENCOUNTER — Other Ambulatory Visit: Payer: Self-pay | Admitting: Family Medicine

## 2015-04-26 ENCOUNTER — Ambulatory Visit
Admission: RE | Admit: 2015-04-26 | Discharge: 2015-04-26 | Disposition: A | Discharge status: Home / Self Care | Payer: Medicare PPO | Source: Ambulatory Visit | Attending: Family Medicine | Admitting: Family Medicine

## 2015-04-26 DIAGNOSIS — Z1239 Encounter for other screening for malignant neoplasm of breast: Secondary | ICD-10-CM

## 2015-04-26 DIAGNOSIS — Z1231 Encounter for screening mammogram for malignant neoplasm of breast: Secondary | ICD-10-CM | POA: Diagnosis present

## 2015-05-02 ENCOUNTER — Ambulatory Visit (INDEPENDENT_AMBULATORY_CARE_PROVIDER_SITE_OTHER): Payer: Medicare PPO | Admitting: Gastroenterology

## 2015-05-02 ENCOUNTER — Encounter: Payer: Self-pay | Admitting: Gastroenterology

## 2015-05-02 VITALS — BP 125/61 | HR 60 | Temp 98.4°F | Ht 65.0 in | Wt 147.0 lb

## 2015-05-02 DIAGNOSIS — R197 Diarrhea, unspecified: Secondary | ICD-10-CM

## 2015-05-02 NOTE — Progress Notes (Signed)
Gastroenterology Consultation  Referring Provider:     Kerman Passey, MD Primary Care Physician:  Baruch Gouty, MD Primary Gastroenterologist:  Dr. Servando Snare     Reason for Consultation:     Diarrhea        HPI:   Alisha Hernandez is a 74 y.o. y/o female referred for consultation & management of diarrhea by Dr. Baruch Gouty, MD.  This patient comes in today with a history of diarrhea. The patient states that she had diarrhea and some blood found in her stool back when she was taking antibiotics for some sinusitis and bronchitis. The patient states that this is all resolved and she is back to having normal bowel movements. She is not reporting any black stools or bloody stools although she had a dark stool back when this all occurred a month ago. There is no further GI problems at this time. She denies any weight loss nausea vomiting fevers or chills. The patient was under the impression that she had a colonoscopy 7 years ago but it turns out that her last colonoscopy was 10/05/2003.  Past Medical History  Diagnosis Date  . Atrial fibrillation (HCC)   . Asthma   . Hypercholesteremia   . Anxiety   . Heart murmur   . C. difficile colitis   . Osteoporosis   . Pseudomembranous colitis     Past Surgical History  Procedure Laterality Date  . Abdominal hysterectomy    . Nasal sinus surgery      Prior to Admission medications   Medication Sig Start Date End Date Taking? Authorizing Provider  ADVAIR DISKUS 250-50 MCG/DOSE AEPB Inhale 1 puff into the lungs 2 (two) times daily.  04/01/15  Yes Historical Provider, MD  albuterol (PROVENTIL HFA;VENTOLIN HFA) 108 (90 BASE) MCG/ACT inhaler Inhale 2 puffs into the lungs every 4 (four) hours as needed for wheezing or shortness of breath.   Yes Historical Provider, MD  ALPRAZolam Prudy Feeler) 0.25 MG tablet Take 0.25 mg by mouth at bedtime as needed. 10/07/12  Yes Historical Provider, MD  cetirizine (ZYRTEC) 10 MG tablet Take 1 tablet by mouth daily. 02/25/07   Yes Historical Provider, MD  cholecalciferol (VITAMIN D) 1000 UNITS tablet Take 1,000 Units by mouth daily.   Yes Historical Provider, MD  co-enzyme Q-10 30 MG capsule Take 30 mg by mouth daily.   Yes Historical Provider, MD  metoprolol tartrate (LOPRESSOR) 25 MG tablet Take 25 mg by mouth 2 (two) times daily. 12/14/14 12/14/15 Yes Historical Provider, MD  Multiple Vitamin (MULTIVITAMIN) tablet Take 1 tablet by mouth daily.   Yes Historical Provider, MD  nitroGLYCERIN (NITROSTAT) 0.4 MG SL tablet Place 1 tablet under the tongue every 5 (five) minutes as needed. For chest pain 05/06/14 05/06/15 Yes Historical Provider, MD  rivaroxaban (XARELTO) 20 MG TABS tablet Take 20 mg by mouth daily with supper.   Yes Historical Provider, MD  sertraline (ZOLOFT) 50 MG tablet TAKE 1 TABLET EVERY DAY 03/12/15  Yes Kerman Passey, MD  simvastatin (ZOCOR) 20 MG tablet TAKE 1 TABLET AT BEDTIME 03/14/15  Yes Steele Sizer, MD  metroNIDAZOLE (FLAGYL) 500 MG tablet Take 1 tablet (500 mg total) by mouth 3 (three) times daily. Patient not taking: Reported on 05/02/2015 04/19/15   Kerman Passey, MD  omega-3 acid ethyl esters (LOVAZA) 1 G capsule Take 1 g by mouth daily.    Historical Provider, MD    Family History  Problem Relation Age of Onset  . Heart  attack Mother   . Heart disease Mother   . Cancer Mother     breast  . Hypertension Mother   . Breast cancer Mother [id number]  . Cancer Father     skin and prostate  . Cancer Sister     lymphoma  . Diabetes Sister   . Stroke Sister   . Diabetes Brother   . COPD Neg Hx      Social History  Substance Use Topics  . Smoking status: Never Smoker   . Smokeless tobacco: Never Used  . Alcohol Use: No    Allergies as of 05/02/2015 - Review Complete 05/02/2015  Allergen Reaction Noted  . Levofloxacin Nausea And Vomiting 10/19/2014    Review of Systems:    All systems reviewed and negative except where noted in HPI.   Physical Exam:  BP 125/61 mmHg  Pulse 60   Temp(Src) 98.4 F (36.9 C) (Oral)  Ht 5\' 5"  (1.651 m)  Wt 147 lb (66.679 kg)  BMI 24.46 kg/m2 No LMP recorded. Patient is postmenopausal. Psych:  Alert and cooperative. Normal mood and affect. General:   Alert,  Well-developed, well-nourished, pleasant and cooperative in NAD Head:  Normocephalic and atraumatic. Eyes:  Sclera clear, no icterus.   Conjunctiva pink. Neurologic:  Alert and oriented x3;  grossly normal neurologically. Skin:  Intact without significant lesions or rashes.  No jaundice. Lymph Nodes:  No significant cervical adenopathy. Psych:  Alert and cooperative. Normal mood and affect.  Imaging Studies: Mm Screening Breast Tomo Bilateral  04/27/2015  CLINICAL DATA:  Screening. EXAM: DIGITAL SCREENING BILATERAL MAMMOGRAM WITH 3D TOMO WITH CAD COMPARISON:  Previous exam(s). ACR Breast Density Category b: There are scattered areas of fibroglandular density. FINDINGS: There are no findings suspicious for malignancy. Images were processed with CAD. IMPRESSION: No mammographic evidence of malignancy. A result letter of this screening mammogram will be mailed directly to the patient. RECOMMENDATION: Screening mammogram in one year. (Code:SM-B-01Y) BI-RADS CATEGORY  1: Negative. Electronically Signed   By: Elberta Fortisaniel  Boyle M.D.   On: 04/27/2015 08:32    Assessment and Plan:   Alisha Hernandez is a 74 y.o. y/o female who had an episode of diarrhea that stool study showed to be negative for any pathogens. The patient states that it was while she was taking antibiotics. The patient states that her antibiotics have stopped until further symptoms. She is no longer having diarrhea or any issues at this time. Due to the patient not having a colonoscopy in the last 11 years the patient will be set up for a screening colonoscopy. The patient has been explained the plan and agrees with it.I have discussed risks & benefits which include, but are not limited to, bleeding, infection, perforation & drug  reaction.  The patient agrees with this plan & written consent will be obtained.      Note: This dictation was prepared with Dragon dictation along with smaller phrase technology. Any transcriptional errors that result from this process are unintentional.

## 2015-05-03 ENCOUNTER — Other Ambulatory Visit: Payer: Self-pay | Admitting: Family Medicine

## 2015-05-04 NOTE — Telephone Encounter (Signed)
Chart briefly reviewed; she saw Dr. Servando SnareWohl on Nov 28th; I'm declining Rx

## 2015-05-04 NOTE — Telephone Encounter (Signed)
Routing to provider  

## 2015-05-04 NOTE — Telephone Encounter (Signed)
rx

## 2015-05-10 ENCOUNTER — Telehealth: Payer: Self-pay

## 2015-05-10 NOTE — Telephone Encounter (Signed)
[facility] called asking for refills on patient and her husband. I explained to the pharmacist Trey Paula(Jeff) they received their Rx from [facility], they didn't need refills.  He said he knew that, the mail hasn't came and they are out of there medicine. The pharmacist asked if he could just have a verbal order for a week while they are here. I agreed and gave the verbal order for patient's zoloft and zocor.

## 2015-07-13 ENCOUNTER — Inpatient Hospital Stay: Payer: Medicare PPO | Attending: Oncology

## 2015-07-13 DIAGNOSIS — J45909 Unspecified asthma, uncomplicated: Secondary | ICD-10-CM | POA: Diagnosis not present

## 2015-07-13 DIAGNOSIS — Z8042 Family history of malignant neoplasm of prostate: Secondary | ICD-10-CM | POA: Insufficient documentation

## 2015-07-13 DIAGNOSIS — I4891 Unspecified atrial fibrillation: Secondary | ICD-10-CM | POA: Diagnosis not present

## 2015-07-13 DIAGNOSIS — D72819 Decreased white blood cell count, unspecified: Secondary | ICD-10-CM | POA: Diagnosis present

## 2015-07-13 DIAGNOSIS — Z803 Family history of malignant neoplasm of breast: Secondary | ICD-10-CM | POA: Insufficient documentation

## 2015-07-13 DIAGNOSIS — R799 Abnormal finding of blood chemistry, unspecified: Secondary | ICD-10-CM | POA: Insufficient documentation

## 2015-07-13 DIAGNOSIS — Z807 Family history of other malignant neoplasms of lymphoid, hematopoietic and related tissues: Secondary | ICD-10-CM | POA: Diagnosis not present

## 2015-07-13 DIAGNOSIS — E7800 Pure hypercholesterolemia, unspecified: Secondary | ICD-10-CM | POA: Diagnosis not present

## 2015-07-13 DIAGNOSIS — Z7901 Long term (current) use of anticoagulants: Secondary | ICD-10-CM | POA: Insufficient documentation

## 2015-07-13 DIAGNOSIS — Z79899 Other long term (current) drug therapy: Secondary | ICD-10-CM | POA: Diagnosis not present

## 2015-07-13 DIAGNOSIS — Z7952 Long term (current) use of systemic steroids: Secondary | ICD-10-CM | POA: Diagnosis not present

## 2015-07-13 LAB — CBC WITH DIFFERENTIAL/PLATELET
Basophils Absolute: 0 10*3/uL (ref 0–0.1)
Basophils Relative: 0 %
Eosinophils Absolute: 0.1 10*3/uL (ref 0–0.7)
Eosinophils Relative: 2 %
HCT: 40.6 % (ref 35.0–47.0)
HEMOGLOBIN: 14.2 g/dL (ref 12.0–16.0)
LYMPHS ABS: 1.3 10*3/uL (ref 1.0–3.6)
LYMPHS PCT: 36 %
MCH: 33 pg (ref 26.0–34.0)
MCHC: 34.9 g/dL (ref 32.0–36.0)
MCV: 94.7 fL (ref 80.0–100.0)
Monocytes Absolute: 0.4 10*3/uL (ref 0.2–0.9)
Monocytes Relative: 12 %
NEUTROS ABS: 1.8 10*3/uL (ref 1.4–6.5)
NEUTROS PCT: 50 %
Platelets: 177 10*3/uL (ref 150–440)
RBC: 4.29 MIL/uL (ref 3.80–5.20)
RDW: 13 % (ref 11.5–14.5)
WBC: 3.6 10*3/uL (ref 3.6–11.0)

## 2015-07-19 LAB — COMP PANEL: LEUKEMIA/LYMPHOMA

## 2015-07-27 ENCOUNTER — Inpatient Hospital Stay (HOSPITAL_BASED_OUTPATIENT_CLINIC_OR_DEPARTMENT_OTHER): Payer: Medicare PPO | Admitting: Oncology

## 2015-07-27 VITALS — BP 152/81 | HR 52 | Temp 97.0°F | Ht 66.0 in | Wt 146.1 lb

## 2015-07-27 DIAGNOSIS — E7800 Pure hypercholesterolemia, unspecified: Secondary | ICD-10-CM

## 2015-07-27 DIAGNOSIS — R799 Abnormal finding of blood chemistry, unspecified: Secondary | ICD-10-CM | POA: Diagnosis not present

## 2015-07-27 DIAGNOSIS — Z803 Family history of malignant neoplasm of breast: Secondary | ICD-10-CM

## 2015-07-27 DIAGNOSIS — Z79899 Other long term (current) drug therapy: Secondary | ICD-10-CM

## 2015-07-27 DIAGNOSIS — I4891 Unspecified atrial fibrillation: Secondary | ICD-10-CM

## 2015-07-27 DIAGNOSIS — Z807 Family history of other malignant neoplasms of lymphoid, hematopoietic and related tissues: Secondary | ICD-10-CM

## 2015-07-27 DIAGNOSIS — D72819 Decreased white blood cell count, unspecified: Secondary | ICD-10-CM | POA: Diagnosis not present

## 2015-07-27 DIAGNOSIS — J45909 Unspecified asthma, uncomplicated: Secondary | ICD-10-CM

## 2015-07-27 DIAGNOSIS — Z7952 Long term (current) use of systemic steroids: Secondary | ICD-10-CM

## 2015-07-27 DIAGNOSIS — Z7901 Long term (current) use of anticoagulants: Secondary | ICD-10-CM

## 2015-07-27 DIAGNOSIS — Z8042 Family history of malignant neoplasm of prostate: Secondary | ICD-10-CM

## 2015-07-27 NOTE — Progress Notes (Signed)
Patient ambulates without assistance, brought to exam room 7.  Patient denies pain or discomfort, vitals stable and documented.  Med Rec updated, information provided by patient.

## 2015-07-27 NOTE — Progress Notes (Signed)
[facility]  [phone] [phone]  ID: Era Bumpers OB: November 09, 1940  MR#: [id number]  VEL#:[id number]  Patient Care Team: Arnetha Courser, MD as PCP - General (Family Medicine)  CHIEF COMPLAINT:  Chief Complaint  Patient presents with  . Follow-up    INTERVAL HISTORY: Patient returns to clinic today for further evaluation and discussion of her laboratory work. She continues to feel well and is asymptomatic. She denies any fevers or illnesses. She has a good appetite and denies weight loss. She has no neurologic complaints. She denies any chest pain or shortness of breath. She denies any nausea, vomiting, constipation, or diarrhea. She has no urinary complaints. Patient offers no specific complaints today.   REVIEW OF SYSTEMS:   Review of Systems  Constitutional: Negative.  Negative for fever, weight loss and malaise/fatigue.  Respiratory: Negative.   Cardiovascular: Negative.   Gastrointestinal: Negative.   Genitourinary: Negative.   Musculoskeletal: Negative.   Neurological: Negative.  Negative for weakness.  Endo/Heme/Allergies: Does not bruise/bleed easily.    As per HPI. Otherwise, a complete review of systems is negatve.  PAST MEDICAL HISTORY: Past Medical History  Diagnosis Date  . Atrial fibrillation ([place])   . Asthma   . Hypercholesteremia   . Anxiety   . Heart murmur   . C. difficile colitis   . Osteoporosis   . Pseudomembranous colitis     PAST SURGICAL HISTORY: Past Surgical History  Procedure Laterality Date  . Abdominal hysterectomy    . Nasal sinus surgery      FAMILY HISTORY Family History  Problem Relation Age of Onset  . Heart attack Mother   . Heart disease Mother   . Cancer Mother     breast  . Hypertension Mother   . Breast cancer Mother 63  . Cancer Father     skin and prostate  . Cancer Sister     lymphoma  . Diabetes Sister   . Stroke Sister   . Diabetes Brother   . COPD Neg Hx         ADVANCED DIRECTIVES:    HEALTH MAINTENANCE: Social History  Substance Use Topics  . Smoking status: Never Smoker   . Smokeless tobacco: Never Used  . Alcohol Use: No     Colonoscopy:  PAP:  Bone density:  Lipid panel:  Allergies  Allergen Reactions  . Levofloxacin Nausea And Vomiting    racing heart    Current Outpatient Prescriptions  Medication Sig Dispense Refill  . ADVAIR DISKUS 250-50 MCG/DOSE AEPB Inhale 1 puff into the lungs 2 (two) times daily.     Marland Kitchen albuterol (PROVENTIL HFA;VENTOLIN HFA) 108 (90 BASE) MCG/ACT inhaler Inhale 2 puffs into the lungs every 4 (four) hours as needed for wheezing or shortness of breath.    . ALPRAZolam (XANAX) 0.25 MG tablet Take 0.25 mg by mouth at bedtime as needed.    . cholecalciferol (VITAMIN D) 1000 UNITS tablet Take 1,000 Units by mouth daily.    . metoprolol tartrate (LOPRESSOR) 25 MG tablet Take 25 mg by mouth 2 (two) times daily.    . Multiple Vitamin (MULTIVITAMIN) tablet Take 1 tablet by mouth daily.    . Multiple Vitamins-Minerals (PRESERVISION/LUTEIN) CAPS Take by mouth.    . predniSONE (DELTASONE) 5 MG tablet 5 mg.    . rivaroxaban (XARELTO) 20 MG TABS tablet Take 20 mg by mouth daily with supper. Reported on 07/27/2015    . sertraline (ZOLOFT) 50 MG tablet TAKE 1 TABLET  EVERY DAY 90 tablet 3  . simvastatin (ZOCOR) 20 MG tablet TAKE 1 TABLET AT BEDTIME 90 tablet 1  . cetirizine (ZYRTEC) 10 MG tablet Take 1 tablet by mouth daily. Reported on 07/27/2015    . co-enzyme Q-10 30 MG capsule Take 30 mg by mouth daily. Reported on 07/27/2015    . metroNIDAZOLE (FLAGYL) 500 MG tablet Take 1 tablet (500 mg total) by mouth 3 (three) times daily. (Patient not taking: Reported on 05/02/2015) 30 tablet 0  . nitroGLYCERIN (NITROSTAT) 0.4 MG SL tablet Place 1 tablet under the tongue every 5 (five) minutes as needed. For chest pain    . omega-3 acid ethyl esters (LOVAZA) 1 G capsule Take 1 g by mouth daily. Reported on 07/27/2015     No  current facility-administered medications for this visit.    OBJECTIVE: Filed Vitals:   07/27/15 1434  BP: 152/81  Pulse: 52  Temp: 97 F (36.1 C)     Body mass index is 23.59 kg/(m^2).    ECOG FS:0 - Asymptomatic  General: Well-developed, well-nourished, no acute distress. Eyes: Pink conjunctiva, anicteric sclera. Lungs: Clear to auscultation bilaterally. Heart: Regular rate and rhythm. No rubs, murmurs, or gallops. Abdomen: Soft, nontender, nondistended. No organomegaly noted, normoactive bowel sounds. Musculoskeletal: No edema, cyanosis, or clubbing. Neuro: Alert, answering all questions appropriately. Cranial nerves grossly intact. Skin: No rashes or petechiae noted. Psych: Normal affect. Lymphatics: No cervical, calvicular, axillary or inguinal LAD.   LAB RESULTS:  Lab Results  Component Value Date   NA 139 04/18/2015   K 4.7 04/18/2015   CL 97 04/18/2015   CO2 27 04/18/2015   GLUCOSE 83 04/18/2015   BUN 13 04/18/2015   CREATININE 0.73 04/18/2015   CALCIUM 9.8 04/18/2015   PROT 6.8 04/18/2015   ALBUMIN 4.2 04/18/2015   AST 17 04/18/2015   ALT 22 04/18/2015   ALKPHOS 61 04/18/2015   BILITOT 0.4 04/18/2015   GFRNONAA 81 04/18/2015   GFRAA 93 04/18/2015    Lab Results  Component Value Date   WBC 3.6 07/13/2015   NEUTROABS 1.8 07/13/2015   HGB 14.2 07/13/2015   HCT 40.6 07/13/2015   MCV 94.7 07/13/2015   PLT 177 07/13/2015     STUDIES: No results found.  ASSESSMENT: Abnormal peripheral blood flow cytometry.  PLAN:    1. History of leukopenia: Patient's white blood cell count is now within normal limits.  2. Abnormal peripheral blood flow cytometry: Report reviewed and noted to have a small clonal B cell population of approximately 2% of cells, which is increased from 1% one year ago. This population is CD5 negative, CD10 negative with a kappa light chain restriction. No other significant immunophenotypic abnormalities were detected. This is of unclear  clinical significance. No intervention is needed at this time. Because patient is asymptomatic, she does not require bone marrow biopsy. Continue to monitor. Patient will return to clinic in 1 year for repeat evaluation.   Patient expressed understanding and was in agreement with this plan. She also understands that She can call clinic at any time with any questions, concerns, or complaints.    Lloyd Huger, MD   07/27/2015 2:47 PM

## 2015-09-13 ENCOUNTER — Other Ambulatory Visit: Payer: Self-pay | Admitting: Family Medicine

## 2016-01-30 ENCOUNTER — Other Ambulatory Visit: Payer: Self-pay | Admitting: Family Medicine

## 2016-02-23 ENCOUNTER — Ambulatory Visit (INDEPENDENT_AMBULATORY_CARE_PROVIDER_SITE_OTHER): Payer: Medicare PPO | Admitting: Family Medicine

## 2016-02-23 ENCOUNTER — Encounter: Payer: Self-pay | Admitting: Family Medicine

## 2016-02-23 VITALS — BP 136/62 | HR 58 | Temp 98.0°F | Wt 145.9 lb

## 2016-02-23 DIAGNOSIS — N63 Unspecified lump in breast: Secondary | ICD-10-CM

## 2016-02-23 DIAGNOSIS — K625 Hemorrhage of anus and rectum: Secondary | ICD-10-CM

## 2016-02-23 DIAGNOSIS — I480 Paroxysmal atrial fibrillation: Secondary | ICD-10-CM | POA: Diagnosis not present

## 2016-02-23 DIAGNOSIS — R5383 Other fatigue: Secondary | ICD-10-CM | POA: Diagnosis not present

## 2016-02-23 DIAGNOSIS — N6311 Unspecified lump in the right breast, upper outer quadrant: Secondary | ICD-10-CM

## 2016-02-23 DIAGNOSIS — M810 Age-related osteoporosis without current pathological fracture: Secondary | ICD-10-CM | POA: Diagnosis not present

## 2016-02-23 DIAGNOSIS — Z5181 Encounter for therapeutic drug level monitoring: Secondary | ICD-10-CM

## 2016-02-23 DIAGNOSIS — Z1231 Encounter for screening mammogram for malignant neoplasm of breast: Secondary | ICD-10-CM

## 2016-02-23 DIAGNOSIS — R350 Frequency of micturition: Secondary | ICD-10-CM

## 2016-02-23 DIAGNOSIS — E785 Hyperlipidemia, unspecified: Secondary | ICD-10-CM

## 2016-02-23 DIAGNOSIS — J4530 Mild persistent asthma, uncomplicated: Secondary | ICD-10-CM | POA: Diagnosis not present

## 2016-02-23 LAB — LIPID PANEL
CHOL/HDL RATIO: 2.2 ratio (ref <=5.0)
Cholesterol: 168 mg/dL (ref 125–200)
HDL: 77 mg/dL (ref >=46)
LDL CALC: 75 mg/dL (ref <130)
Triglycerides: 82 mg/dL (ref <150)
VLDL: 16 mg/dL (ref <30)

## 2016-02-23 LAB — CBC WITH DIFFERENTIAL/PLATELET
BASOS ABS: 0 {cells}/uL (ref 0–200)
BASOS PCT: 0 %
EOS ABS: 99 {cells}/uL (ref 15–500)
Eosinophils Relative: 3 %
HEMATOCRIT: 39.7 % (ref 35.0–45.0)
Hemoglobin: 13.7 g/dL (ref 11.7–15.5)
LYMPHS PCT: 45 %
Lymphs Abs: 1485 cells/uL (ref 850–3900)
MCH: 32.2 pg (ref 27.0–33.0)
MCHC: 34.5 g/dL (ref 32.0–36.0)
MCV: 93.4 fL (ref 80.0–100.0)
MONO ABS: 396 {cells}/uL (ref 200–950)
MPV: 11.7 fL (ref 7.5–12.5)
Monocytes Relative: 12 %
NEUTROS ABS: 1320 {cells}/uL — AB (ref 1500–7800)
Neutrophils Relative %: 40 %
Platelets: 189 10*3/uL (ref 140–400)
RBC: 4.25 MIL/uL (ref 3.80–5.10)
RDW: 13.9 % (ref 11.0–15.0)
WBC: 3.3 10*3/uL — ABNORMAL LOW (ref 3.8–10.8)

## 2016-02-23 LAB — TSH: TSH: 1.01 m[IU]/L

## 2016-02-23 NOTE — Assessment & Plan Note (Signed)
Get diag mammo and [place]

## 2016-02-23 NOTE — Assessment & Plan Note (Signed)
Monitored by Dr. Darrold JunkerParaschos

## 2016-02-23 NOTE — Assessment & Plan Note (Signed)
[facility] Dr[facility] Ramond Dialonohue

## 2016-02-23 NOTE — Progress Notes (Signed)
Patient ID: Alisha Hernandez, female   DOB: 03/10/1941, 75 y.o.   MRN: [id number]   Subjective:   Alisha Hernandez is a 75 y.o. female here for a complete physical exam; however, she does not know if her insurance covers a yearly physical; she does not think she has a supplement that covers complete physicals, so she opted to not do the physical after all; she does not want a Medicare Wellness visit when it was explained  Interim issues since last visit: she had a bad bladder infection, had to go to urgent care She would like to have a physical and get labs and a urinalysis  USPSTF grade A and B recommendations Alcohol: no Depression: Depression screen [facility] 2/9 02/23/2016  Decreased Interest 0  Down, Depressed, Hopeless 0  PHQ - 2 Score 0  taking sertraline  Hypertension: well-controlled Obesity: no Tobacco use: never smoker HIV, hep B, hep C: not interested STD testing and prevention (chl/gon/syphilis): not interested Lipids: done on Ocrober 12, 2016, LDL 101, on statin Glucose: normal 83 November 2016 Colorectal cancer: not interested Breast cancer: discussed mammo every 1-2 years Intimate partner violence: no Cervical cancer screening: graduated Lung cancer: n/a Osteoporosis: patient not interested in DEXA scan Fall prevention/vitamin D: taking vit D 400 iu, then picked up 1000 iu AAA: no fam hx Aspirin: not taking aspirin, on Xarelto instead Diet: pretty good eater, likes sweets Exercise: not as active this year, was doing line dancing, will try to increase activity Skin cancer: rough brown bump on left arm  Past Medical History:  Diagnosis Date  . Anxiety   . Asthma   . Atrial fibrillation (HCC)   . C. difficile colitis   . Heart murmur   . Hypercholesteremia   . Osteoporosis   . Pseudomembranous colitis    Past Surgical History:  Procedure Laterality Date  . ABDOMINAL HYSTERECTOMY    . NASAL SINUS SURGERY     Family History  Problem Relation Age of Onset  . Heart  attack Mother   . Heart disease Mother   . Cancer Mother     breast  . Hypertension Mother   . Breast cancer Mother 23  . Cancer Father     skin and prostate  . Cancer Sister     lymphoma  . Diabetes Sister   . Stroke Sister   . Diabetes Brother   . COPD Neg Hx    Social History  Substance Use Topics  . Smoking status: Never Smoker  . Smokeless tobacco: Never Used  . Alcohol use No   Review of Systems  Constitutional: Positive for fatigue. Negative for unexpected weight change.  Gastrointestinal: Positive for blood in stool (bleeding from rectum with straining).  Genitourinary:       Some pressure   Objective:   Vitals:   02/23/16 0830  BP: 136/62  Pulse: (!) 58  Temp: 98 F (36.7 C)  SpO2: 96%  Weight: 145 lb 14.4 oz (66.2 kg)   Body mass index is 23.55 kg/m. Wt Readings from Last 3 Encounters:  02/23/16 145 lb 14.4 oz (66.2 kg)  07/27/15 146 lb 0.9 oz (66.3 kg)  05/02/15 147 lb (66.7 kg)   Physical Exam  Constitutional: She appears well-developed and well-nourished.  HENT:  Right Ear: Hearing, tympanic membrane, external ear and ear canal normal.  Left Ear: Hearing, tympanic membrane, external ear and ear canal normal.  Eyes: EOM are normal. No scleral icterus.  Cardiovascular: S1 normal and S2 normal.  Bradycardia present.   Pulmonary/Chest: Effort normal. No respiratory distress. Right breast exhibits mass (soft rubbery mass 11 o'clock RIGHT breast). Right breast exhibits no inverted nipple, no nipple discharge, no skin change and no tenderness. Left breast exhibits no inverted nipple, no mass, no nipple discharge, no skin change and no tenderness. Breasts are symmetrical.  Abdominal: Normal appearance. She exhibits no distension.  Musculoskeletal: Normal range of motion. She exhibits no edema.  Lymphadenopathy:    She has no axillary adenopathy.  Neurological: She is alert. She displays no tremor.  Reflex Scores:      Patellar reflexes are 2+ on the  right side and 2+ on the left side. Skin: Skin is warm and dry. Lesion (rough keratotic light brown spot on left forearm) noted. No pallor.  rough  Psychiatric: Her speech is normal and behavior is normal. Her mood appears not anxious. She does not exhibit a depressed mood.   Assessment/Plan:   Problem List Items Addressed This Visit      Cardiovascular and Mediastinum   A-fib (HCC)    Monitored by Dr. Darrold JunkerParaschos      Relevant Medications   metoprolol tartrate (LOPRESSOR) 25 MG tablet     Respiratory   Asthma    [facility] Ramond Dial[facility]ue        Digestive   Bright red rectal bleeding    Discussed possibility of hemorrhoid, but also anal cancer or rectal cancer; refer to GI doctor      Relevant Orders   Ambulatory referral to Gastroenterology     Musculoskeletal and Integument   Osteoporosis    Not interested in DEXA scan; get 1000 iu of vitamin D3 daily; fall precautions encouraged; 3 servings of calcium daily        Other   Visit for screening mammogram    Monthly SBE and yearly mammograms recommended      Urinary frequency    Check urine      Relevant Orders   Urinalysis w microscopic + reflex cultur   Medication monitoring encounter    Check CMP and CBC      Relevant Orders   CBC with Differential/Platelet   HLD (hyperlipidemia) - Primary    Check lipids today; avoid saturated fats      Relevant Medications   metoprolol tartrate (LOPRESSOR) 25 MG tablet   Other Relevant Orders   Lipid panel   Fatigue    Check labs today      Relevant Orders   CBC with Differential/Platelet   TSH   Breast lump on right side at 11 o'clock position    Get diag mammo and US      Relevant Orders   MM Digital Diagnostic Bilat   US BREAST LTD UNI LEFT INC AXILLA   US BREAST LTD UNI RIGHT INC AXILLA    Other Visit Diagnoses   None.     Meds ordered this encounter  Medications  . metoprolol tartrate (LOPRESSOR) 25 MG tablet    Sig: Take 25 mg by mouth 2  (two) times daily.   Orders Placed This Encounter  Procedures  . MM Digital Diagnostic Bilat    Order Specific Question:   Reason for Exam (SYMPTOM  OR DIAGNOSIS REQUIRED)    Answer:   breast lump 11 o'clock RIGHT breast    Order Specific Question:   Preferred imaging location?    Answer:   [facility]  . US BREAST LTD UNI LEFT INC AXILLA    Order Specific  Question:   Reason for Exam (SYMPTOM  OR DIAGNOSIS REQUIRED)    Answer:   breast lump 11 o'clock RIGHT breast    Order Specific Question:   Preferred imaging location?    Answer:   [facility]  . US BREAST LTD UNI RIGHT INC AXILLA    Order Specific Question:   Reason for Exam (SYMPTOM  OR DIAGNOSIS REQUIRED)    Answer:   breast lump 11 o'clock RIGHT breast    Order Specific Question:   Preferred imaging location?    Answer[facility]    [facility]  . CBC with Differential/Platelet  . Lipid panel  . TSH  . Urinalysis w microscopic + reflex cultur  . Ambulatory referral to Gastroenterology    Referral Priority:   Routine    Referral Type:   Consultation    Referral Reason:   Specialty Services Required    Number of Visits Requested:   1   Follow up plan: Return in about 6 months (around 08/22/2016) for for regular follow-up; Medicare Wellness visit when due.  An After Visit Summary was printed and given to the patient.

## 2016-02-23 NOTE — Assessment & Plan Note (Signed)
Check CMP and CBC  

## 2016-02-23 NOTE — Assessment & Plan Note (Signed)
Check urine.

## 2016-02-23 NOTE — Assessment & Plan Note (Signed)
Check lipids today; avoid saturated fats 

## 2016-02-23 NOTE — Assessment & Plan Note (Signed)
Discussed possibility of hemorrhoid, but also anal cancer or rectal cancer; refer to GI doctor

## 2016-02-23 NOTE — Assessment & Plan Note (Signed)
Monthly SBE and yearly mammograms recommended

## 2016-02-23 NOTE — Assessment & Plan Note (Signed)
Check labs today.

## 2016-02-23 NOTE — Assessment & Plan Note (Signed)
Not interested in DEXA scan; get 1000 iu of vitamin D3 daily; fall precautions encouraged; 3 servings of calcium daily

## 2016-02-23 NOTE — Patient Instructions (Addendum)
Continue vitamin D3 and take 1,000 iu daily We'll schedule the mammogram and ultrasound  Try to limit saturated fats in your diet (bologna, hot dogs, barbeque, cheeseburgers, hamburgers, steak, bacon, sausage, cheese, etc.) and get more fresh fruits, vegetables, and whole grains  We'll refer you to Dr. Servando SnareWohl to see about your bleeding

## 2016-02-24 LAB — URINALYSIS W MICROSCOPIC + REFLEX CULTURE
BACTERIA UA: NONE SEEN [HPF]
BILIRUBIN URINE: NEGATIVE
CRYSTALS: NONE SEEN [HPF]
Casts: NONE SEEN [LPF]
GLUCOSE, UA: NEGATIVE
Hgb urine dipstick: NEGATIVE
KETONES UR: NEGATIVE
Nitrite: NEGATIVE
PROTEIN: NEGATIVE
RBC / HPF: NONE SEEN RBC/HPF (ref <=2)
Specific Gravity, Urine: 1.016 (ref 1.001–1.035)
Squamous Epithelial / LPF: NONE SEEN [HPF] (ref <=5)
WBC, UA: NONE SEEN WBC/HPF (ref <=5)
Yeast: NONE SEEN [HPF]
pH: 6 (ref 5.0–8.0)

## 2016-02-26 LAB — URINE CULTURE

## 2016-02-29 ENCOUNTER — Telehealth: Payer: Self-pay

## 2016-02-29 NOTE — Telephone Encounter (Signed)
Pt called regarding her Referral for GI specialist. Inform the patient of her appointment date and time. She was also confused about her labs. She didn't know if she had to call her hematology doctor back. Explain her results of her WBC. Stated to her since her WBC were low Dr. Sherie DonLada would like her to call her Hematology and follow up with that provider. She also was concern about her Breast Imaging appointment. Looked in to the referral Queue and saw that helen was waiting on information from her insurance. Once everything has been approve she will get a call with a appointment date and time.   Kipp LaurenceAmber Chritopher Coster, CMA

## 2016-03-01 ENCOUNTER — Telehealth: Payer: Self-pay | Admitting: *Deleted

## 2016-03-01 DIAGNOSIS — D72819 Decreased white blood cell count, unspecified: Secondary | ICD-10-CM

## 2016-03-01 NOTE — Telephone Encounter (Signed)
Patient had labs drawn at Dr. Marlise EvesLada's office and would like for Dr. Orlie DakinFinnegan to evaluate lab work to see if needs to come in to be seen. Next appt is scheduled on 08/03/16 for labs and then follow up with Dr. Orlie DakinFinnegan on 08/08/16. Please advise if patient needs to be seen sooner.

## 2016-03-02 NOTE — Telephone Encounter (Signed)
Yes, I would like to see a repeat flow cytometry.  Thanks.

## 2016-03-02 NOTE — Telephone Encounter (Signed)
Lab orders are in. Message sent to scheduling to schedule patient.

## 2016-03-05 ENCOUNTER — Inpatient Hospital Stay: Payer: Medicare PPO | Attending: Oncology

## 2016-03-05 DIAGNOSIS — D72819 Decreased white blood cell count, unspecified: Secondary | ICD-10-CM

## 2016-03-05 DIAGNOSIS — E7800 Pure hypercholesterolemia, unspecified: Secondary | ICD-10-CM | POA: Insufficient documentation

## 2016-03-05 DIAGNOSIS — Z807 Family history of other malignant neoplasms of lymphoid, hematopoietic and related tissues: Secondary | ICD-10-CM | POA: Insufficient documentation

## 2016-03-05 DIAGNOSIS — Z8042 Family history of malignant neoplasm of prostate: Secondary | ICD-10-CM | POA: Insufficient documentation

## 2016-03-05 DIAGNOSIS — Z79899 Other long term (current) drug therapy: Secondary | ICD-10-CM | POA: Diagnosis not present

## 2016-03-05 DIAGNOSIS — D72820 Lymphocytosis (symptomatic): Secondary | ICD-10-CM | POA: Diagnosis not present

## 2016-03-05 DIAGNOSIS — I4891 Unspecified atrial fibrillation: Secondary | ICD-10-CM | POA: Insufficient documentation

## 2016-03-05 DIAGNOSIS — J45909 Unspecified asthma, uncomplicated: Secondary | ICD-10-CM | POA: Diagnosis not present

## 2016-03-05 DIAGNOSIS — Z7901 Long term (current) use of anticoagulants: Secondary | ICD-10-CM | POA: Insufficient documentation

## 2016-03-05 LAB — CBC WITH DIFFERENTIAL/PLATELET
BASOS ABS: 0 10*3/uL (ref 0–0.1)
BASOS PCT: 1 %
EOS ABS: 0.1 10*3/uL (ref 0–0.7)
Eosinophils Relative: 2 %
HCT: 39.6 % (ref 35.0–47.0)
Hemoglobin: 13.9 g/dL (ref 12.0–16.0)
Lymphocytes Relative: 43 %
Lymphs Abs: 1.9 10*3/uL (ref 1.0–3.6)
MCH: 33.1 pg (ref 26.0–34.0)
MCHC: 35 g/dL (ref 32.0–36.0)
MCV: 94.6 fL (ref 80.0–100.0)
MONO ABS: 0.5 10*3/uL (ref 0.2–0.9)
MONOS PCT: 11 %
NEUTROS PCT: 43 %
Neutro Abs: 1.8 10*3/uL (ref 1.4–6.5)
PLATELETS: 174 10*3/uL (ref 150–440)
RBC: 4.19 MIL/uL (ref 3.80–5.20)
RDW: 13.6 % (ref 11.5–14.5)
WBC: 4.2 10*3/uL (ref 3.6–11.0)

## 2016-03-06 ENCOUNTER — Inpatient Hospital Stay: Payer: Medicare PPO

## 2016-03-06 ENCOUNTER — Other Ambulatory Visit: Payer: Medicare PPO

## 2016-03-07 ENCOUNTER — Telehealth: Payer: Self-pay | Admitting: Family Medicine

## 2016-03-07 LAB — COMP PANEL: LEUKEMIA/LYMPHOMA: Immunophenotypic Profile: 3

## 2016-03-07 NOTE — Telephone Encounter (Signed)
Pt states she is still waiting to hear about an appt for her diagnostics mammogram. Please return her call.

## 2016-03-07 NOTE — Telephone Encounter (Signed)
Called scheduled for oct 23 @ 10:40

## 2016-03-11 DIAGNOSIS — D72820 Lymphocytosis (symptomatic): Secondary | ICD-10-CM | POA: Insufficient documentation

## 2016-03-11 NOTE — Progress Notes (Signed)
[place]  [phone] [phone]  ID: Alisha Hernandez OB: 30-Mar-1941  MR#: [id number]  HAL#:[id number]  Patient Care Team: Arnetha Courser, MD as PCP - General (Family Medicine)  CHIEF COMPLAINT: Monoclonal B-cell lymphocytosis of unknown significance.  INTERVAL HISTORY: Patient returns to clinic today for routine yearly evaluation and discussion of her laboratory work. She continues to feel well and is asymptomatic. She denies any fevers or illnesses. She has a good appetite and denies weight loss. She has no neurologic complaints. She denies any chest pain or shortness of breath. She denies any nausea, vomiting, constipation, or diarrhea. She has no urinary complaints. Patient offers no specific complaints today.   REVIEW OF SYSTEMS:   Review of Systems  Constitutional: Negative.  Negative for fever, malaise/fatigue and weight loss.  Respiratory: Negative.  Negative for cough and shortness of breath.   Cardiovascular: Negative.  Negative for chest pain and leg swelling.  Gastrointestinal: Negative.  Negative for abdominal pain.  Genitourinary: Negative.   Musculoskeletal: Negative.   Neurological: Negative.  Negative for weakness.  Endo/Heme/Allergies: Does not bruise/bleed easily.  Psychiatric/Behavioral: Negative.  The patient is not nervous/anxious.     As per HPI. Otherwise, a complete review of systems is negative.  PAST MEDICAL HISTORY: Past Medical History:  Diagnosis Date  . Anxiety   . Asthma   . Atrial fibrillation ([place])   . C. difficile colitis   . Heart murmur   . Hypercholesteremia   . Osteoporosis   . Pseudomembranous colitis     PAST SURGICAL HISTORY: Past Surgical History:  Procedure Laterality Date  . ABDOMINAL HYSTERECTOMY    . NASAL SINUS SURGERY      FAMILY HISTORY Family History  Problem Relation Age of Onset  . Heart attack Mother   . Heart disease Mother   . Cancer Mother     breast  . Hypertension  Mother   . Breast cancer Mother 56  . Cancer Father     skin and prostate  . Cancer Sister     lymphoma  . Diabetes Sister   . Stroke Sister   . Diabetes Brother   . COPD Neg Hx        ADVANCED DIRECTIVES:    HEALTH MAINTENANCE: Social History  Substance Use Topics  . Smoking status: Never Smoker  . Smokeless tobacco: Never Used  . Alcohol use No     Colonoscopy:  PAP:  Bone density:  Lipid panel:  Allergies  Allergen Reactions  . Levofloxacin Nausea And Vomiting    racing heart    Current Outpatient Prescriptions  Medication Sig Dispense Refill  . ADVAIR DISKUS 250-50 MCG/DOSE AEPB Inhale 1 puff into the lungs 2 (two) times daily.     Marland Kitchen albuterol (PROVENTIL HFA;VENTOLIN HFA) 108 (90 BASE) MCG/ACT inhaler Inhale 2 puffs into the lungs every 4 (four) hours as needed for wheezing or shortness of breath.    . ALPRAZolam (XANAX) 0.25 MG tablet Take 0.25 mg by mouth at bedtime as needed.    . cetirizine (ZYRTEC) 10 MG tablet Take 1 tablet by mouth daily. Reported on 07/27/2015    . cholecalciferol (VITAMIN D) 1000 UNITS tablet Take 1,000 Units by mouth daily.    Marland Kitchen co-enzyme Q-10 30 MG capsule Take 30 mg by mouth daily. Reported on 07/27/2015    . metoprolol tartrate (LOPRESSOR) 25 MG tablet Take 25 mg by mouth 2 (two) times daily.    . metroNIDAZOLE (FLAGYL) 500 MG tablet Take  1 tablet (500 mg total) by mouth 3 (three) times daily. 30 tablet 0  . Multiple Vitamin (MULTIVITAMIN) tablet Take 1 tablet by mouth daily.    . Multiple Vitamins-Minerals (PRESERVISION/LUTEIN) CAPS Take by mouth.    . nitroGLYCERIN (NITROSTAT) 0.4 MG SL tablet Place 1 tablet under the tongue every 5 (five) minutes as needed. For chest pain    . omega-3 acid ethyl esters (LOVAZA) 1 G capsule Take 1 g by mouth daily. Reported on 07/27/2015    . predniSONE (DELTASONE) 5 MG tablet 5 mg.    . rivaroxaban (XARELTO) 20 MG TABS tablet Take 20 mg by mouth daily with supper. Reported on 07/27/2015    .  simvastatin (ZOCOR) 20 MG tablet TAKE 1 TABLET AT BEDTIME 90 tablet 1  . sertraline (ZOLOFT) 50 MG tablet TAKE 1 TABLET EVERY DAY 90 tablet 3   No current facility-administered medications for this visit.     OBJECTIVE: Vitals:   03/12/16 1600  BP: (!) 148/73  Pulse: 84  Resp: 18  Temp: (!) 95 F (35 C)     Body mass index is 23.52 kg/m.    ECOG FS:0 - Asymptomatic  General: Well-developed, well-nourished, no acute distress. Eyes: Pink conjunctiva, anicteric sclera. Lungs: Clear to auscultation bilaterally. Heart: Regular rate and rhythm. No rubs, murmurs, or gallops. Abdomen: Soft, nontender, nondistended. No organomegaly noted, normoactive bowel sounds. Musculoskeletal: No edema, cyanosis, or clubbing. Neuro: Alert, answering all questions appropriately. Cranial nerves grossly intact. Skin: No rashes or petechiae noted. Psych: Normal affect. Lymphatics: No cervical, calvicular, axillary or inguinal LAD.   LAB RESULTS:  Lab Results  Component Value Date   NA 139 04/18/2015   K 4.7 04/18/2015   CL 97 04/18/2015   CO2 27 04/18/2015   GLUCOSE 83 04/18/2015   BUN 13 04/18/2015   CREATININE 0.73 04/18/2015   CALCIUM 9.8 04/18/2015   PROT 6.8 04/18/2015   ALBUMIN 4.2 04/18/2015   AST 17 04/18/2015   ALT 22 04/18/2015   ALKPHOS 61 04/18/2015   BILITOT 0.4 04/18/2015   GFRNONAA 81 04/18/2015   GFRAA 93 04/18/2015    Lab Results  Component Value Date   WBC 4.2 03/05/2016   NEUTROABS 1.8 03/05/2016   HGB 13.9 03/05/2016   HCT 39.6 03/05/2016   MCV 94.6 03/05/2016   PLT 174 03/05/2016     STUDIES: No results found.  ASSESSMENT: Monoclonal B-cell lymphocytosis of unknown significance.  PLAN:    1. Monoclonal B-cell lymphocytosis of unknown significance: Report reviewed and noted to have a small clonal B cell population of approximately 3% of cells, which is increased from 2% one year ago and 1% of the population in 2015. This population is CD5 negative, CD10  negative with a kappa light chain restriction. No other significant immunophenotypic abnormalities were detected. This is of unclear clinical significance. No intervention is needed at this time. Because patient is asymptomatic, she does not require bone marrow biopsy. Continue to monitor. Patient will return to clinic in 1 year for repeat evaluation.   Patient expressed understanding and was in agreement with this plan. She also understands that She can call clinic at any time with any questions, concerns, or complaints.    Lloyd Huger, MD   03/17/2016 4:50 PM

## 2016-03-12 ENCOUNTER — Inpatient Hospital Stay (HOSPITAL_BASED_OUTPATIENT_CLINIC_OR_DEPARTMENT_OTHER): Payer: Medicare PPO | Admitting: Oncology

## 2016-03-12 VITALS — BP 148/73 | HR 84 | Temp 95.0°F | Resp 18 | Wt 145.7 lb

## 2016-03-12 DIAGNOSIS — Z79899 Other long term (current) drug therapy: Secondary | ICD-10-CM

## 2016-03-12 DIAGNOSIS — E7800 Pure hypercholesterolemia, unspecified: Secondary | ICD-10-CM

## 2016-03-12 DIAGNOSIS — D72820 Lymphocytosis (symptomatic): Secondary | ICD-10-CM

## 2016-03-12 DIAGNOSIS — I4891 Unspecified atrial fibrillation: Secondary | ICD-10-CM

## 2016-03-12 DIAGNOSIS — Z7901 Long term (current) use of anticoagulants: Secondary | ICD-10-CM

## 2016-03-12 DIAGNOSIS — J45909 Unspecified asthma, uncomplicated: Secondary | ICD-10-CM | POA: Diagnosis not present

## 2016-03-12 NOTE — Progress Notes (Signed)
States recently saw Dr. Sherie DonLada and results were forwarded to our office for further evaluation. Pt had repeat labs drawn on 10/2.

## 2016-03-14 ENCOUNTER — Other Ambulatory Visit: Payer: Self-pay | Admitting: Family Medicine

## 2016-03-26 ENCOUNTER — Encounter: Payer: Self-pay | Admitting: Radiology

## 2016-03-26 ENCOUNTER — Ambulatory Visit
Admission: RE | Admit: 2016-03-26 | Discharge: 2016-03-26 | Disposition: A | Discharge status: Home / Self Care | Payer: Medicare PPO | Source: Ambulatory Visit | Attending: Family Medicine | Admitting: Family Medicine

## 2016-03-26 ENCOUNTER — Ambulatory Visit: Admission: RE | Admit: 2016-03-26 | Payer: Medicare PPO | Source: Ambulatory Visit

## 2016-03-26 DIAGNOSIS — N6311 Unspecified lump in the right breast, upper outer quadrant: Secondary | ICD-10-CM | POA: Diagnosis present

## 2016-04-03 ENCOUNTER — Ambulatory Visit (INDEPENDENT_AMBULATORY_CARE_PROVIDER_SITE_OTHER): Payer: Medicare PPO | Admitting: Gastroenterology

## 2016-04-03 ENCOUNTER — Encounter: Payer: Self-pay | Admitting: Gastroenterology

## 2016-04-03 VITALS — BP 108/57 | HR 59 | Temp 98.0°F | Ht 66.0 in | Wt 145.8 lb

## 2016-04-03 DIAGNOSIS — K921 Melena: Secondary | ICD-10-CM

## 2016-04-03 NOTE — Progress Notes (Signed)
Primary Care Physician: Baruch GoutyMelinda Lada, MD  Primary Gastroenterologist:  Dr. Midge Miniumarren Benard Minturn  Chief Complaint  Patient presents with  . Rectal Bleeding    HPI: Alisha Hernandez is a 75 y.o. female here for rectal bleeding. The patient has not had a colonoscopy in 10 years and states she has some bright red blood on the toilet paper without any blood in the toilet or mixed with the stools. The patient denies any unexplained weight loss, fevers, chills, nausea or vomiting. The patient has been hesitant to undergo another colonoscopy because she states that she has asthma and she is on a blood thinner and is worried about her atrial fibrillation.  Current Outpatient Prescriptions  Medication Sig Dispense Refill  . ADVAIR DISKUS 250-50 MCG/DOSE AEPB Inhale 1 puff into the lungs 2 (two) times daily.     Marland Kitchen. albuterol (PROVENTIL HFA;VENTOLIN HFA) 108 (90 BASE) MCG/ACT inhaler Inhale 2 puffs into the lungs every 4 (four) hours as needed for wheezing or shortness of breath.    . ALPRAZolam (XANAX) 0.25 MG tablet Take 0.25 mg by mouth at bedtime as needed.    . cholecalciferol (VITAMIN D) 1000 UNITS tablet Take 1,000 Units by mouth daily.    Marland Kitchen. co-enzyme Q-10 30 MG capsule Take 30 mg by mouth daily. Reported on 07/27/2015    . metoprolol tartrate (LOPRESSOR) 25 MG tablet Take 25 mg by mouth 2 (two) times daily.    . Multiple Vitamin (MULTIVITAMIN) tablet Take 1 tablet by mouth daily.    . Multiple Vitamins-Minerals (PRESERVISION/LUTEIN) CAPS Take by mouth.    . omega-3 acid ethyl esters (LOVAZA) 1 G capsule Take 1 g by mouth daily. Reported on 07/27/2015    . rivaroxaban (XARELTO) 20 MG TABS tablet Take 20 mg by mouth daily with supper. Reported on 07/27/2015    . sertraline (ZOLOFT) 50 MG tablet TAKE 1 TABLET EVERY DAY 90 tablet 3  . simvastatin (ZOCOR) 20 MG tablet TAKE 1 TABLET AT BEDTIME 90 tablet 1  . cetirizine (ZYRTEC) 10 MG tablet Take 1 tablet by mouth daily. Reported on 07/27/2015    . nitroGLYCERIN  (NITROSTAT) 0.4 MG SL tablet Place 1 tablet under the tongue every 5 (five) minutes as needed. For chest pain     No current facility-administered medications for this visit.     Allergies as of 04/03/2016 - Review Complete 04/03/2016  Allergen Reaction Noted  . Levofloxacin Nausea And Vomiting 10/19/2014    ROS:  General: Negative for anorexia, weight loss, fever, chills, fatigue, weakness. ENT: Negative for hoarseness, difficulty swallowing , nasal congestion. CV: Negative for chest pain, angina, palpitations, dyspnea on exertion, peripheral edema.  Respiratory: Negative for dyspnea at rest, dyspnea on exertion, cough, sputum, wheezing.  GI: See history of present illness. GU:  Negative for dysuria, hematuria, urinary incontinence, urinary frequency, nocturnal urination.  Endo: Negative for unusual weight change.    Physical Examination:   BP (!) 108/57   Pulse (!) 59   Temp 98 F (36.7 C) (Oral)   Ht 5\' 6"  (1.676 m)   Wt 145 lb 12.8 oz (66.1 kg)   BMI 23.53 kg/m   General: Well-nourished, well-developed in no acute distress.  Eyes: No icterus. Conjunctivae pink. Rectal: Internal hemorrhoids palpated without any external hemorrhoids or anal fissures rectal tone is normal  Abdomen: Bowel sounds are normal, nontender, nondistended, no hepatosplenomegaly or masses, no abdominal bruits or hernia , no rebound or guarding.   Extremities: No lower extremity edema. No clubbing  or deformities. Neuro: Alert and oriented x 3.  Grossly intact. Skin: Warm and dry, no jaundice.   Psych: Alert and cooperative, normal mood and affect.  Labs:    Imaging Studies: [employer] [facility] Uni Right Inc Axilla  Result Date: 03/26/2016 CLINICAL DATA:  Palpable abnormality noted in the 11 o'clock location of the right breast on recent physical exam. The patient has not noted any changes herself. EXAM: 2D DIGITAL DIAGNOSTIC BILATERAL MAMMOGRAM WITH CAD AND ADJUNCT TOMO ULTRASOUND RIGHT BREAST  COMPARISON:  04/26/2015 and earlier ACR Breast Density Category b: There are scattered areas of fibroglandular density. FINDINGS: No suspicious mass, distortion, or microcalcifications are identified to suggest presence of malignancy. Mammographic images were processed with CAD. On physical exam, I palpate soft ridge of tissue in the upper-outer quadrant of the right breast. I palpate no discrete mass in this region. Targeted ultrasound is performed, showing normal appearing fibroglandular tissue in the upper-outer quadrant of the right breast. No mass, distortion, or acoustic shadowing is demonstrated with ultrasound. IMPRESSION: No mammographic or ultrasound evidence for malignancy. RECOMMENDATION: Screening mammogram in one year.(Code:SM-B-01Y) I have discussed the findings and recommendations with the patient. Results were also provided in writing at the conclusion of the visit. If applicable, a reminder letter will be sent to the patient regarding the next appointment. BI-RADS CATEGORY  1: Negative. Electronically Signed   By: Norva PavlovElizabeth  Brown M.D.   On: 03/26/2016 11:40   Mm Diag Breast Tomo Bilateral  Result Date: 03/26/2016 CLINICAL DATA:  Palpable abnormality noted in the 11 o'clock location of the right breast on recent physical exam. The patient has not noted any changes herself. EXAM: 2D DIGITAL DIAGNOSTIC BILATERAL MAMMOGRAM WITH CAD AND ADJUNCT TOMO ULTRASOUND RIGHT BREAST COMPARISON:  04/26/2015 and earlier ACR Breast Density Category b: There are scattered areas of fibroglandular density. FINDINGS: No suspicious mass, distortion, or microcalcifications are identified to suggest presence of malignancy. Mammographic images were processed with CAD. On physical exam, I palpate soft ridge of tissue in the upper-outer quadrant of the right breast. I palpate no discrete mass in this region. Targeted ultrasound is performed, showing normal appearing fibroglandular tissue in the upper-outer quadrant of  the right breast. No mass, distortion, or acoustic shadowing is demonstrated with ultrasound. IMPRESSION: No mammographic or ultrasound evidence for malignancy. RECOMMENDATION: Screening mammogram in one year.(Code:SM-B-01Y) I have discussed the findings and recommendations with the patient. Results were also provided in writing at the conclusion of the visit. If applicable, a reminder letter will be sent to the patient regarding the next appointment. BI-RADS CATEGORY  1: Negative. Electronically Signed   By: Norva PavlovElizabeth  Brown M.D.   On: 03/26/2016 11:40    Assessment and Plan:   Alisha Hernandez is a 75 y.o. y/o female who has a history of rectal bleeding which is likely due to her internal hemorrhoids found on rectal exam today. The patient has no masses or lesions externally or fissures felt internally. The patient has been encouraged to undergo a colonoscopy and she will think about it. The patient has been told to increase fiber in her diet and avoid straining with her stools. The patient will contact my office if she decides to undergo colonoscopy.   Note: This dictation was prepared with Dragon dictation along with smaller phrase technology. Any transcriptional errors that result from this process are unintentional.

## 2016-04-03 NOTE — Patient Instructions (Signed)
Please increase your fiber and take a stool softener daily. If you decide to schedule a colonoscopy, please contact the office.

## 2016-06-27 ENCOUNTER — Telehealth: Payer: Self-pay

## 2016-06-27 NOTE — Telephone Encounter (Signed)
Letter generated and sent.

## 2016-06-27 NOTE — Telephone Encounter (Signed)
-----   Message from Gabriel Cirriheryl Wicker, NP sent at 06/18/2016  5:02 PM EST ----- Regarding: Chest CT This pt is due for a chest CT.  Can someone call her and ask her if we can order it?    Thanks.   ----- Message ----- From: SYSTEM Sent: 06/17/2016  12:06 AM To: Gabriel Cirriheryl Wicker, NP

## 2016-07-26 ENCOUNTER — Other Ambulatory Visit: Payer: Medicare PPO

## 2016-08-03 ENCOUNTER — Other Ambulatory Visit: Payer: Medicare PPO

## 2016-08-08 ENCOUNTER — Ambulatory Visit: Payer: Medicare PPO | Admitting: Oncology

## 2016-08-23 ENCOUNTER — Ambulatory Visit: Payer: Medicare PPO | Admitting: Family Medicine

## 2016-11-20 ENCOUNTER — Encounter: Payer: Self-pay | Admitting: Family Medicine

## 2016-11-20 ENCOUNTER — Ambulatory Visit (INDEPENDENT_AMBULATORY_CARE_PROVIDER_SITE_OTHER): Payer: Medicare PPO | Admitting: Family Medicine

## 2016-11-20 VITALS — BP 106/64 | HR 68 | Temp 98.0°F | Resp 14 | Wt 139.2 lb

## 2016-11-20 DIAGNOSIS — R109 Unspecified abdominal pain: Secondary | ICD-10-CM

## 2016-11-20 DIAGNOSIS — I480 Paroxysmal atrial fibrillation: Secondary | ICD-10-CM

## 2016-11-20 DIAGNOSIS — L900 Lichen sclerosus et atrophicus: Secondary | ICD-10-CM

## 2016-11-20 DIAGNOSIS — R413 Other amnesia: Secondary | ICD-10-CM

## 2016-11-20 DIAGNOSIS — R102 Pelvic and perineal pain: Secondary | ICD-10-CM

## 2016-11-20 DIAGNOSIS — D72820 Lymphocytosis (symptomatic): Secondary | ICD-10-CM | POA: Diagnosis not present

## 2016-11-20 DIAGNOSIS — E782 Mixed hyperlipidemia: Secondary | ICD-10-CM

## 2016-11-20 DIAGNOSIS — Z5181 Encounter for therapeutic drug level monitoring: Secondary | ICD-10-CM

## 2016-11-20 LAB — CBC WITH DIFFERENTIAL/PLATELET
BASOS PCT: 0 %
Basophils Absolute: 0 cells/uL (ref 0–200)
EOS ABS: 62 {cells}/uL (ref 15–500)
Eosinophils Relative: 2 %
HCT: 43 % (ref 35.0–45.0)
Hemoglobin: 14.3 g/dL (ref 11.7–15.5)
LYMPHS PCT: 48 %
Lymphs Abs: 1488 cells/uL (ref 850–3900)
MCH: 31.6 pg (ref 27.0–33.0)
MCHC: 33.3 g/dL (ref 32.0–36.0)
MCV: 94.9 fL (ref 80.0–100.0)
MONOS PCT: 13 %
MPV: 10.8 fL (ref 7.5–12.5)
Monocytes Absolute: 403 cells/uL (ref 200–950)
NEUTROS PCT: 37 %
Neutro Abs: 1147 cells/uL — ABNORMAL LOW (ref 1500–7800)
PLATELETS: 209 10*3/uL (ref 140–400)
RBC: 4.53 MIL/uL (ref 3.80–5.10)
RDW: 13.7 % (ref 11.0–15.0)
WBC: 3.1 10*3/uL — ABNORMAL LOW (ref 3.8–10.8)

## 2016-11-20 LAB — LIPID PANEL
Cholesterol: 242 mg/dL — ABNORMAL HIGH (ref <200)
HDL: 70 mg/dL (ref >50)
LDL Cholesterol: 150 mg/dL — ABNORMAL HIGH (ref <100)
Total CHOL/HDL Ratio: 3.5 Ratio (ref <5.0)
Triglycerides: 110 mg/dL (ref <150)
VLDL: 22 mg/dL (ref <30)

## 2016-11-20 LAB — COMPLETE METABOLIC PANEL WITH GFR
ALT: 22 U/L (ref 6–29)
AST: 31 U/L (ref 10–35)
Albumin: 4.6 g/dL (ref 3.6–5.1)
Alkaline Phosphatase: 68 U/L (ref 33–130)
BILIRUBIN TOTAL: 0.5 mg/dL (ref 0.2–1.2)
BUN: 13 mg/dL (ref 7–25)
CALCIUM: 9.9 mg/dL (ref 8.6–10.4)
CO2: 31 mmol/L (ref 20–31)
CREATININE: 0.67 mg/dL (ref 0.60–0.93)
Chloride: 104 mmol/L (ref 98–110)
GFR, Est Non African American: 85 mL/min (ref >=60)
Glucose, Bld: 80 mg/dL (ref 65–99)
Potassium: 4.7 mmol/L (ref 3.5–5.3)
Sodium: 141 mmol/L (ref 135–146)
TOTAL PROTEIN: 7.6 g/dL (ref 6.1–8.1)

## 2016-11-20 LAB — POCT URINALYSIS DIPSTICK
BILIRUBIN UA: NEGATIVE
Blood, UA: NEGATIVE
Glucose, UA: NEGATIVE
KETONES UA: NEGATIVE
LEUKOCYTES UA: NEGATIVE
Nitrite, UA: NEGATIVE
PROTEIN UA: NEGATIVE
Spec Grav, UA: 1.02 (ref 1.010–1.025)
Urobilinogen, UA: 0.2 E.U./dL
pH, UA: 5.5 (ref 5.0–8.0)

## 2016-11-20 NOTE — Assessment & Plan Note (Signed)
Recheck CBC. 

## 2016-11-20 NOTE — Assessment & Plan Note (Signed)
seeing Dr. Darrold JunkerParaschos

## 2016-11-20 NOTE — Progress Notes (Signed)
BP 106/64   Pulse 68   Temp 98 F (36.7 C) (Oral)   Resp 14   Wt 139 lb 3.2 oz (63.1 kg)   SpO2 94%   BMI 22.47 kg/m    Subjective:    Patient ID: Alisha Hernandez, female    DOB: 1940/12/02, 76 y.o.   MRN: [id number]  HPI: Alisha Hernandez is a 76 y.o. female  Chief Complaint  Patient presents with  . Medication Refill    discuss meds and labs  . Flank Pain    right side    HPI Patient is here for follow-up  Also having right sided flank pain; started low in the right side abdomen; doesn't hurt all the time; she was doing a lot of lifting when her husband had surgery; just gives way; still line dances; walks 4 miles a day; ovaries are gone she says, but surgical history says just hysterectomy; age 24 or 43; done for heavy bleeding; pain in the vagina at times; long long time; uses cortisone cream; thinks she is allergic to soap; no abd bloating, no early satiety, no nausea  She sees cardiologist, Dr. Darrold Junker; they stopped her zocor; it wasn't working with the diltiazem; she thought maybe checking fasting labs to see if cholesterol is going up; atrial fibrillation comes and goes; feels normal today  Everyone in the family has diabetes and she wants to be checked; no blurred vision, no dry mouth  She is on xarelto; she is concerned about memory loss; she is having trouble with names, misplacing things; long time; not sure if she wants to be checked for Alzheimer's; nothing major, lays something down and then forgets where she laid it; she has lost a little weight, but thinks thyroid checked not too long ago; Sept 2017, TSH was completely Hernandez Results  Component Value Date   CHOL 168 02/23/2016   CHOL 199 03/16/2015   Hernandez Results  Component Value Date   HDL 77 02/23/2016   HDL 79 03/16/2015   Hernandez Results  Component Value Date   LDLCALC 75 02/23/2016   LDLCALC 101 (H) 03/16/2015   Hernandez Results  Component Value Date   TRIG 82 02/23/2016   TRIG 94 03/16/2015   Hernandez  Results  Component Value Date   CHOLHDL 2.2 02/23/2016   No results found for: LDLDIRECT  Asthma doing better; she has not had any problems at all, even with hotter weather  She sees oncologist, Dr. Orlie Dakin; she does not think she's seen him for a while; last visit Mar 12, 2016  Depression screen [facility] 2/9 11/20/2016 02/23/2016  Decreased Interest 0 0  Down, Depressed, Hopeless 0 0  PHQ - 2 Score 0 0    Relevant past medical, surgical, family and social history reviewed Past Medical History:  Diagnosis Date  . Anxiety   . Asthma   . Atrial fibrillation (HCC)   . C. difficile colitis   . Heart murmur   . Hypercholesteremia   . Osteoporosis   . Pseudomembranous colitis    Past Surgical History:  Procedure Laterality Date  . ABDOMINAL HYSTERECTOMY    . NASAL SINUS SURGERY     Family History  Problem Relation Age of Onset  . Heart attack Mother   . Heart disease Mother   . Cancer Mother        breast  . Hypertension Mother   . Breast cancer Mother 74  . Cancer Father  skin and prostate  . Cancer Sister        lymphoma  . Diabetes Sister   . Stroke Sister   . Diabetes Brother   . COPD Neg Hx    Social History   Social History  . Marital status: Married    Spouse name: N/A  . Number of children: N/A  . Years of education: N/A   Occupational History  . Not on file.   Social History Main Topics  . Smoking status: Never Smoker  . Smokeless tobacco: Never Used  . Alcohol use No  . Drug use: No  . Sexual activity: Not on file   Other Topics Concern  . Not on file   Social History Narrative  . No narrative on file    Interim medical history since last visit reviewed. Allergies and medications reviewed  Review of Systems Per HPI unless specifically indicated above     Objective:    BP 106/64   Pulse 68   Temp 98 F (36.7 C) (Oral)   Resp 14   Wt 139 lb 3.2 oz (63.1 kg)   SpO2 94%   BMI 22.47 kg/m   Wt Readings from Last 3 Encounters:    11/20/16 139 lb 3.2 oz (63.1 kg)  04/03/16 145 lb 12.8 oz (66.1 kg)  03/12/16 145 lb 11.6 oz (66.1 kg)    Physical Exam  Constitutional: She appears well-developed and well-nourished. No distress.  HENT:  Head: Normocephalic and atraumatic.  Eyes: EOM are normal. No scleral icterus.  Neck: No thyromegaly present.  Cardiovascular: Normal rate, regular rhythm and normal heart sounds.   No extrasystoles are present.  Sounds to be in normal sinus rhythm today  Pulmonary/Chest: Effort normal and breath sounds normal. No respiratory distress. She has no wheezes.  Abdominal: Soft. Bowel sounds are normal. She exhibits no distension and no mass. There is no hepatosplenomegaly. There is no tenderness. There is no guarding.  Genitourinary: Rectal exam shows no external hemorrhoid and no fissure.    Right adnexum displays no mass, no tenderness and no fullness. Left adnexum displays no mass, no tenderness and no fullness.  Genitourinary Comments: Hypopigmentation of vulva, clitoral hood, with apparent fusion superiorly; vaginal atrophy  Musculoskeletal: Normal range of motion. She exhibits no edema.  Neurological: She is alert. She exhibits normal muscle tone.  Skin: Skin is warm and dry. No rash noted. She is not diaphoretic. No pallor.  Specifically, no rash over the posterolateral RIGHT flank suggestive of shingles  Psychiatric: She has a normal mood and affect. Her behavior is normal. Judgment and thought content normal.   Results for orders placed or performed in visit on 11/20/16  POCT urinalysis dipstick  Result Value Ref Range   Color, UA light yellow    Clarity, UA clear    Glucose, UA neg    Bilirubin, UA neg    Ketones, UA neg    Spec Grav, UA 1.020 1.010 - 1.025   Blood, UA neg    pH, UA 5.5 5.0 - 8.0   Protein, UA neg    Urobilinogen, UA 0.2 0.2 or 1.0 E.U./dL   Nitrite, UA neg    Leukocytes, UA Negative Negative      Assessment & Plan:   Problem List Items Addressed  This Visit      Cardiovascular and Mediastinum   A-fib (HCC)    seeing Dr. Darrold Junker      Relevant Medications   CARTIA XT 120 MG 24 hr  capsule   diltiazem (CARDIZEM) 60 MG tablet     Other   Monoclonal B-cell lymphocytosis of unknown significance    Recheck CBC      Relevant Orders   CBC with Differential/Platelet   Medication monitoring encounter    Check labs      Relevant Orders   COMPLETE METABOLIC PANEL WITH GFR   HLD (hyperlipidemia)    Check lipids; avoid fatty meats      Relevant Medications   CARTIA XT 120 MG 24 hr capsule   diltiazem (CARDIZEM) 60 MG tablet   Other Relevant Orders   Lipid panel    Other Visit Diagnoses    Lichen sclerosus et atrophicus    -  Primary   Relevant Orders   Ambulatory referral to Gynecology   Acute right flank pain       Relevant Orders   POCT urinalysis dipstick (Completed)   Pain in female pelvis       Relevant Orders   Ambulatory referral to Gynecology   Memory difficulties       Relevant Orders   B12      Follow up plan: Return in about 6 months (around 05/22/2017) for twenty minute follow-up with fasting labs.  An after-visit summary was printed and given to the patient at check-out.  Please see the patient instructions which may contain other information and recommendations beyond what is mentioned above in the assessment and plan.  Meds ordered this encounter  Medications  . CARTIA XT 120 MG 24 hr capsule    Sig: Take 120 mg by mouth daily.  Marland Kitchen. diltiazem (CARDIZEM) 60 MG tablet    Sig: Take 60 mg by mouth as needed. If heart starts racing  . Ascorbic Acid (VITAMIN C) 100 MG tablet    Sig: Take 100 mg by mouth daily.  . Multiple Vitamins-Minerals (PRESERVISION AREDS 2 PO)    Sig: Take by mouth.    Orders Placed This Encounter  Procedures  . COMPLETE METABOLIC PANEL WITH GFR  . CBC with Differential/Platelet  . Lipid panel  . B12  . Ambulatory referral to Gynecology  . POCT urinalysis dipstick

## 2016-11-20 NOTE — Patient Instructions (Addendum)
You will be due for your mammogram on or just after March 27, 2017 Let's get labs today You are welcome to take 100 or 250 or 500 mcg of vitamin B12 daily to help memory if desired

## 2016-11-20 NOTE — Assessment & Plan Note (Signed)
Check lipids; avoid fatty meats

## 2016-11-20 NOTE — Assessment & Plan Note (Signed)
Check labs 

## 2016-11-21 LAB — VITAMIN B12: VITAMIN B 12: 535 pg/mL (ref 200–1100)

## 2016-11-28 ENCOUNTER — Telehealth: Payer: Self-pay | Admitting: Obstetrics & Gynecology

## 2016-11-28 NOTE — Telephone Encounter (Signed)
Pt is being referred by [facility] for Lichen sclerosus et atrophicus and Pelvic pain. I called and left voicemail for patient to call back to be schedule

## 2016-11-30 NOTE — Telephone Encounter (Signed)
Left voicemail for patient to call back to be schedule x 2 °

## 2016-12-03 NOTE — Telephone Encounter (Signed)
Pt is schedule 12/24/16 with Dr. Jean RosenthalJackson

## 2016-12-04 DIAGNOSIS — M7070 Other bursitis of hip, unspecified hip: Secondary | ICD-10-CM | POA: Insufficient documentation

## 2016-12-13 ENCOUNTER — Ambulatory Visit: Payer: Medicare PPO | Admitting: Family Medicine

## 2016-12-24 ENCOUNTER — Encounter: Payer: Self-pay | Admitting: Obstetrics and Gynecology

## 2016-12-24 ENCOUNTER — Ambulatory Visit (INDEPENDENT_AMBULATORY_CARE_PROVIDER_SITE_OTHER): Payer: Medicare PPO | Admitting: Obstetrics and Gynecology

## 2016-12-24 VITALS — BP 118/74 | Ht 65.0 in | Wt 141.0 lb

## 2016-12-24 DIAGNOSIS — L900 Lichen sclerosus et atrophicus: Secondary | ICD-10-CM | POA: Diagnosis not present

## 2016-12-24 NOTE — Patient Instructions (Signed)
Instructions for steroid use: Apply to affected area: Twice daily for two weeks Once daily at bed time for two weeks Monday-Wednesday-Friday at bedtime for two weeks  Use barrier emollient (aquaphor, vasoline, etc) twice daily, every day.

## 2016-12-24 NOTE — Progress Notes (Signed)
Obstetrics & Gynecology Office Visit   Chief Complaint  Patient presents with  . Pelvic Pain  Referral from Dr. Baruch Gouty at [facility]  History of Present Illness: 76 y.o. [id number] female who presents in referral from Dr. Baruch Gouty from [facility] for likely lichen sclerosis. The patient notes a one-year history of vulvar itching and irritation for the past year or so.  The itching location is described as being "on the top" area.  Pressed further, she indicates her peri-clitoral area.  She has tried several remedies at home, including; changing of her soap that she uses to clean the area.  She noted a slight improvement with this measure.  She also tried over the counter topical steroids, which she noted some relief, but this was short-lived.  She tried topical hydrocortisone once per day for 3-4 days and would stop.  She states that there have been times where the skin has bled with scratching.  She denies vaginal bleeding, weight loss.  She denies a history of skin cancer.  Nothing seems to make the itching worse.  She denies any other associated symptoms.    Past Medical History:  Diagnosis Date  . Anxiety   . Asthma   . Atrial fibrillation (HCC)   . C. difficile colitis   . Heart murmur   . Hypercholesteremia   . Osteoporosis   . Pseudomembranous colitis     Past Surgical History:  Procedure Laterality Date  . ABDOMINAL HYSTERECTOMY    . NASAL SINUS SURGERY      Gynecologic History: No LMP recorded. Patient has had a hysterectomy.  Obstetric History: [id number]  Family History  Problem Relation Age of Onset  . Heart attack Mother   . Heart disease Mother   . Cancer Mother        breast  . Hypertension Mother   . Breast cancer Mother 5  . Cancer Father        skin and prostate  . Cancer Sister        lymphoma  . Diabetes Sister   . Stroke Sister   . Diabetes Brother   . COPD Neg Hx     Social History   Social History  .  Marital status: Married    Spouse name: N/A  . Number of children: N/A  . Years of education: N/A   Occupational History  . Not on file.   Social History Main Topics  . Smoking status: Never Smoker  . Smokeless tobacco: Never Used  . Alcohol use No  . Drug use: No  . Sexual activity: Not on file   Other Topics Concern  . Not on file   Social History Narrative  . No narrative on file    Allergies  Allergen Reactions  . Penicillin G Other (See Comments)    Pt. Can't remember the side effect, but has always not taken it.  . Levofloxacin Nausea And Vomiting    racing heart      Medication Sig Start Date End Date Taking? Authorizing Provider  ADVAIR DISKUS 250-50 MCG/DOSE AEPB Inhale 1 puff into the lungs daily.  04/01/15   [provider]  albuterol (PROVENTIL HFA;VENTOLIN HFA) 108 (90 BASE) MCG/ACT inhaler Inhale 2 puffs into the lungs every 4 (four) hours as needed for wheezing or shortness of breath.    [provider]  ALPRAZolam Prudy Feeler) 0.25 MG tablet Take 0.25 mg by mouth at bedtime as needed. 10/07/12   [provider]  Ascorbic Acid (VITAMIN C) 100 MG tablet Take 100 mg by mouth daily.    [provider]  CARTIA XT 120 MG 24 hr capsule Take 120 mg by mouth daily. 10/11/16   [provider]  cholecalciferol (VITAMIN D) 1000 UNITS tablet Take 1,000 Units by mouth daily.    [provider]  diltiazem (CARDIZEM) 60 MG tablet Take 60 mg by mouth as needed. If heart starts racing 09/27/16   [provider]  Multiple Vitamin (MULTIVITAMIN) tablet Take 1 tablet by mouth daily.    [provider]  Multiple Vitamins-Minerals (PRESERVISION AREDS 2 PO) Take by mouth.    [provider]  nitroGLYCERIN (NITROSTAT) 0.4 MG SL tablet Place 1 tablet under the tongue every 5 (five) minutes as needed. For chest pain 05/06/14 03/12/16  [provider]  rivaroxaban (XARELTO) 20 MG TABS tablet Take 20 mg by  mouth daily with supper. Reported on 07/27/2015    [provider]  sertraline (ZOLOFT) 50 MG tablet TAKE 1 TABLET EVERY DAY 03/15/16   Lada, Janit Bern, MD    Review of Systems  Constitutional: Negative.  Negative for malaise/fatigue and weight loss.  HENT: Negative.   Eyes: Negative.   Respiratory: Negative.   Cardiovascular: Negative.   Gastrointestinal: Negative.  Negative for blood in stool and melena.  Genitourinary: Negative.  Negative for hematuria.  Musculoskeletal: Negative.   Skin: Positive for itching and rash (per HPI).  Neurological: Negative.   Psychiatric/Behavioral: Negative.      Physical Exam BP 118/74   Ht 5\' 5"  (1.651 m)   Wt 141 lb (64 kg)   BMI 23.46 kg/m  No LMP recorded. Patient has had a hysterectomy. Physical Exam  Constitutional: She is oriented to person, place, and time. She appears well-developed and well-nourished. No distress.  Genitourinary: Vagina normal. Pelvic exam was performed with patient supine. There is labial fusion (clitoral hood to clitoral area loss of architecture of anterior labia minora structures).  There is rash and lesion on the right labia. There is no tenderness or injury on the right labia.  There is rash and lesion on the left labia. There is no tenderness or injury on the left labia.    Vagina exhibits no lesion. No erythema, tenderness or bleeding in the vagina. No signs of injury around the vagina. Right adnexum does not display mass and does not display tenderness. Left adnexum does not display mass and does not display tenderness.  Genitourinary Comments: Cervix and uterus surgically absent   HENT:  Head: Normocephalic and atraumatic.  Eyes: EOM are normal. No scleral icterus.  Neck: Normal range of motion. Neck supple.  Cardiovascular: Normal rate and regular rhythm.   Pulmonary/Chest: Effort normal and breath sounds normal. No respiratory distress. She has no wheezes. She has no rales.  Abdominal: Soft.  Bowel sounds are normal. She exhibits no distension and no mass. There is no tenderness. There is no rebound and no guarding.  Musculoskeletal: Normal range of motion. She exhibits no edema.  Neurological: She is alert and oriented to person, place, and time. No cranial nerve deficit.  Skin: Skin is warm and dry. Rash (see GU exam) noted. No erythema.  Psychiatric: She has a normal mood and affect. Her behavior is normal. Judgment normal.   Skin biopsy procedure note: After verbal consent obtained, the skin was prepped with betadine.  1% plain lidocaine (2mL) injected in the area of biopsy.  A 3mm punch biopsy instrument was used to removed  a 3mm diameter skin biopsy.  The skin was elevated with pick-ups and the deep tissue removed with scissors.  The biopsy specimen was placed in formalin.  Hemostasis was obtained with Monsel's solution.  The remaining betadine was removed with a wet 4 x 4.   The patient tolerated well.    Female chaperone present for pelvic and breast  portions of the physical exam  Assessment: 76 y.o. [id number] female Lichen sclerosus - biopsy performed today with 5% risk of squamous cell carcinoma with lichens sclerosis.  - treat with continued reduction in exposure to soaps.  - use hypoallergenic detergent on clothes - avoid excessive scratching - treat with triamcinolone 0.1% as follows:  - BID x 2 weeks  - HS x 2 weeks  - M-W-F HS x 2 weeks - instructed patient to treat entire area, as noted in picture - may use barrier topical such as Aquaphor or vasoline to protect skin. - discussed importance of follow up.   Plan: Problem List Items Addressed This Visit    Lichen sclerosus - Primary (Chronic)    - biopsy performed today with 5% risk of squamous cell carcinoma with lichens sclerosis.  - treat with continued reduction in exposure to soaps.  - use hypoallergenic detergent on clothes - avoid excessive scratching - treat with triamcinolone 0.1% as follows:  - BID  x 2 weeks  - HS x 2 weeks  - M-W-F HS x 2 weeks - instructed patient to treat entire area, as noted in picture - may use barrier topical such as Aquaphor or vasoline to protect skin. - discussed importance of follow up.      Relevant Medications   triamcinolone ointment (KENALOG) 0.1 %   Other Relevant Orders   Pathology      Thomasene MohairStephen Jackson, MD 12/25/2016 2:08 PM     CC: Kerman PasseyLada, Melinda P, MD [address] [place], [place] [id number]

## 2016-12-25 ENCOUNTER — Telehealth: Payer: Self-pay | Admitting: Obstetrics and Gynecology

## 2016-12-25 MED ORDER — TRIAMCINOLONE ACETONIDE 0.1 % EX OINT: 1.0000 "application " | TOPICAL_OINTMENT | Freq: Two times a day (BID) | CUTANEOUS | 0 refills | Status: DC

## 2016-12-25 MED ORDER — TRIAMCINOLONE ACETONIDE 0.1 % EX OINT: TOPICAL_OINTMENT | Freq: Two times a day (BID) | CUTANEOUS | Status: DC

## 2016-12-25 NOTE — Telephone Encounter (Signed)
Meriam SpragueBeverly, Please let this patient know that I just sent this in. There was a delay in getting this sent in yesterday.  It should be there soon.  Thank you!

## 2016-12-25 NOTE — Assessment & Plan Note (Addendum)
-   biopsy performed today with 5% risk of squamous cell carcinoma with lichens sclerosis.  - treat with continued reduction in exposure to soaps.  - use hypoallergenic detergent on clothes - avoid excessive scratching - treat with triamcinolone 0.1% as follows:  - BID x 2 weeks  - HS x 2 weeks  - M-W-F HS x 2 weeks - instructed patient to treat entire area, as noted in picture - may use barrier topical such as Aquaphor or vasoline to protect skin. - discussed importance of follow up.

## 2016-12-25 NOTE — Telephone Encounter (Signed)
Pt aware.

## 2016-12-25 NOTE — Telephone Encounter (Signed)
Pt was seen yesterday with Dr. Jean RosenthalJackson. Pt went to her pharmacy [facility] to pick up her prescription  and it wasn't there.

## 2016-12-28 LAB — PATHOLOGY

## 2017-01-01 ENCOUNTER — Other Ambulatory Visit: Payer: Self-pay

## 2017-01-01 NOTE — Telephone Encounter (Signed)
Spoke with sandy, CMA about having Dr. Darrold JunkerParaschos continue to monitor and prescribing simvastatin since he was the one that took her off and added it back on. Andrey CampanileSandy states that will be fine. I will contact the pt and notified the changes.

## 2017-01-01 NOTE — Telephone Encounter (Signed)
Pt has been notified of the changes. Also told Dr.Paraschos nurse that she only have 1 week worth of simvastatin and will need a refill.

## 2017-01-01 NOTE — Telephone Encounter (Signed)
See last lab note; please ask her to call Dr. Darrold JunkerParaschos about simvastatin refill My understanding is he stopped it, so I'll respectfully ask him to manage her cholesterol Thank you

## 2017-01-01 NOTE — Telephone Encounter (Signed)
Pt was put back on simvastatin 20 mg since her cholesterol was  high. Pt asked me to call Dr. Darrold JunkerParaschos office  Regarding managing her cholesterol medication. Called His office and left a message with his nurse.

## 2017-01-03 ENCOUNTER — Telehealth: Payer: Self-pay

## 2017-01-03 NOTE — Telephone Encounter (Signed)
Bethann HumblePam, Pham tech [facility] concern about pt simvastatin. I stated to her that Dr. lada is not the one who is prescribing or monitoring this medicine. Any med refill for this medicine will [facility].

## 2017-01-21 ENCOUNTER — Telehealth: Payer: Self-pay

## 2017-01-21 NOTE — Telephone Encounter (Signed)
It was negative for lichen sclerosis but did show some inflammatory changes, Dr. Jean Rosenthal can get back to her about long term management when he's back

## 2017-01-21 NOTE — Telephone Encounter (Signed)
Please advise 

## 2017-01-21 NOTE — Telephone Encounter (Signed)
Pt aware of AMS message. Please schedule F/U with SDJ for results and also bladder dropping. Thank you

## 2017-01-21 NOTE — Telephone Encounter (Signed)
Pt calling for bx results from 3wks ago.  Also another problem, feels like something is kind of dropping.  Her bottom doesn't feel right.  [phone]

## 2017-01-22 NOTE — Telephone Encounter (Signed)
Spoke with patient and add patient to the cancellation list for when appointments come open with SDJ

## 2017-01-29 ENCOUNTER — Encounter: Payer: Self-pay | Admitting: Family Medicine

## 2017-01-29 ENCOUNTER — Ambulatory Visit (INDEPENDENT_AMBULATORY_CARE_PROVIDER_SITE_OTHER): Payer: Medicare PPO | Admitting: Family Medicine

## 2017-01-29 VITALS — BP 118/64 | HR 80 | Temp 97.5°F | Resp 14 | Wt 138.2 lb

## 2017-01-29 DIAGNOSIS — R102 Pelvic and perineal pain: Secondary | ICD-10-CM

## 2017-01-29 DIAGNOSIS — L900 Lichen sclerosus et atrophicus: Secondary | ICD-10-CM | POA: Diagnosis not present

## 2017-01-29 DIAGNOSIS — R3989 Other symptoms and signs involving the genitourinary system: Secondary | ICD-10-CM

## 2017-01-29 LAB — POCT URINALYSIS DIPSTICK
Bilirubin, UA: NEGATIVE
Glucose, UA: NEGATIVE
KETONES UA: NEGATIVE
Leukocytes, UA: NEGATIVE
Nitrite, UA: NEGATIVE
PROTEIN UA: NEGATIVE
RBC UA: NEGATIVE
SPEC GRAV UA: 1.015 (ref 1.010–1.025)
Urobilinogen, UA: 0.2 E.U./dL
pH, UA: 5.5 (ref 5.0–8.0)

## 2017-01-29 NOTE — Patient Instructions (Signed)
We'll schedule the ultrasound of your pelvis and contact you If you have not heard anything from my staff in a week about any orders/referrals/studies from today, please contact us here to follow-up [phone] Please let me know if anything changes

## 2017-01-29 NOTE — Progress Notes (Signed)
BP 118/64   Pulse 80   Temp (!) 97.5 F (36.4 C) (Oral)   Resp 14   Wt 138 lb 3.2 oz (62.7 kg)   SpO2 98%   BMI 23.00 kg/m    Subjective:    Patient ID: Alisha Hernandez, female    DOB: [birth year], 76 y.o.   MRN: [id number]  HPI: Alisha Hernandez is a 76 y.o. female  Chief Complaint  Patient presents with  . Urinary Tract Infection    supra pubic pressure  . Bladder Prolapse    feels like bladder has dropped? has an appt in about 2weeks with GYN    HPI Patient is here for an acute visit She feels something has dropped down; new When she gets ready to clean herself, feels like a soft lump Does not feel good, like having a period or something; goes and comes Feels like her tummy gets bloated, pointing over pelvic region She had the hysterectomy for heavy periods; would get sick; thinks she still has ovaries; no cancer on the uterus No dysuria; just a little pressure We reviewed the pathology from vulvar biopsy Using cream once a day now she says Saw GYN just a month ago; reviewed his office note, diagram  Depression screen [facility] 2/9 01/29/2017 11/20/2016 02/23/2016  Decreased Interest 0 0 0  Down, Depressed, Hopeless 0 0 0  PHQ - 2 Score 0 0 0    Relevant past medical, surgical, family and social history reviewed Past Medical History:  Diagnosis Date  . Anxiety   . Asthma   . Atrial fibrillation (HCC)   . C. difficile colitis   . Heart murmur   . Hypercholesteremia   . Osteoporosis   . Pseudomembranous colitis    Past Surgical History:  Procedure Laterality Date  . ABDOMINAL HYSTERECTOMY    . NASAL SINUS SURGERY     Family History  Problem Relation Age of Onset  . Heart attack Mother   . Heart disease Mother   . Cancer Mother        breast  . Hypertension Mother   . Breast cancer Mother 88  . Cancer Father        skin and prostate  . Cancer Sister        lymphoma  . Diabetes Sister   . Stroke Sister   . Diabetes Brother   . COPD Neg Hx    Social  History   Social History  . Marital status: Married    Spouse name: N/A  . Number of children: N/A  . Years of education: N/A   Occupational History  . Not on file.   Social History Main Topics  . Smoking status: Never Smoker  . Smokeless tobacco: Never Used  . Alcohol use No  . Drug use: No  . Sexual activity: Not Currently    Birth control/ protection: Post-menopausal   Other Topics Concern  . Not on file   Social History Narrative  . No narrative on file    Interim medical history since last visit reviewed. Allergies and medications reviewed  Review of Systems Per HPI unless specifically indicated above     Objective:    BP 118/64   Pulse 80   Temp (!) 97.5 F (36.4 C) (Oral)   Resp 14   Wt 138 lb 3.2 oz (62.7 kg)   SpO2 98%   BMI 23.00 kg/m   Wt Readings from Last 3 Encounters:  01/29/17 138 lb 3.2 oz (  62.7 kg)  12/24/16 141 lb (64 kg)  11/20/16 139 lb 3.2 oz (63.1 kg)    Physical Exam  Constitutional: She appears well-developed and well-nourished.  Cardiovascular: Normal rate.   Pulmonary/Chest: Effort normal.  Abdominal: Soft. Normal appearance and bowel sounds are normal. She exhibits no mass. There is no tenderness. There is no guarding and no tenderness at McBurney's point.  Genitourinary: There is rash on the right labia. There is rash on the left labia. Right adnexum displays no mass, no tenderness and no fullness. Left adnexum displays fullness. Left adnexum displays no mass and no tenderness. No erythema, tenderness or bleeding in the vagina. No foreign body in the vagina. No signs of injury around the vagina. No vaginal discharge found.  Genitourinary Comments: Hypopigmentation of the vulvar tissue; minimal bladder prolapse; no rectoceole  Psychiatric: Her mood appears not anxious.   Results for orders placed or performed in visit on 01/29/17  POCT urinalysis dipstick  Result Value Ref Range   Color, UA light yellow    Clarity, UA clear     Glucose, UA neg    Bilirubin, UA neg    Ketones, UA neg    Spec Grav, UA 1.015 1.010 - 1.025   Blood, UA neg    pH, UA 5.5 5.0 - 8.0   Protein, UA neg    Urobilinogen, UA 0.2 0.2 or 1.0 E.U./dL   Nitrite, UA neg    Leukocytes, UA Negative Negative      Assessment & Plan:   Problem List Items Addressed This Visit      Musculoskeletal and Integument   Lichen sclerosus (Chronic)    Based on GYN impression; reviewed path report, which does not show typical features; keep f/u with GYN       Other Visit Diagnoses    Pelvic pressure in female    -  Primary   minimal bladder prolapse; nothing on UA to explain sx; will get pelvic US; she is going on a trip, will get when she gets back; call sooner if worsening   Relevant Orders   US Transvaginal Non-OB   US Pelvis Complete   Sensation of pressure in bladder area       Relevant Orders   POCT urinalysis dipstick (Completed)   US Transvaginal Non-OB   US Pelvis Complete       Follow up plan: No Follow-up on file.  An after-visit summary was printed and given to the patient at check-out.  Please see the patient instructions which may contain other information and recommendations beyond what is mentioned above in the assessment and plan.  Meds ordered this encounter  Medications  . simvastatin (ZOCOR) 10 MG tablet    Sig: Take 1 tablet by mouth at bedtime.    Orders Placed This Encounter  Procedures  . US Transvaginal Non-OB  . US Pelvis Complete  . POCT urinalysis dipstick

## 2017-01-29 NOTE — Assessment & Plan Note (Signed)
Based on GYN impression; reviewed path report, which does not show typical features; keep f/u with GYN

## 2017-01-30 ENCOUNTER — Telehealth: Payer: Self-pay | Admitting: Obstetrics and Gynecology

## 2017-01-30 ENCOUNTER — Telehealth: Payer: Self-pay

## 2017-01-30 NOTE — Telephone Encounter (Signed)
I contacted this patient to inform her that she has been scheduled to have her [facility] Tuesday[facility] February [facility] 2017 [facility], but there was no answer. A message was left for this patient with this information on it and the instruction to start drinking 32 oz of water 1 hour before but to be done within 30mins without using the restroom. Patient was also advised to arrive 15 mins early.

## 2017-01-30 NOTE — Telephone Encounter (Signed)
Spoke with patient and discussed results. Will continue to treat as inflammation as no neoplastic process noted.  Discussed the equivocal biopsy result.  Will consider re-biopsy, if she does not respond to the steroid treatment. Follow up already scheduled for 9/17.

## 2017-02-03 ENCOUNTER — Encounter: Payer: Self-pay | Admitting: Emergency Medicine

## 2017-02-03 ENCOUNTER — Emergency Department
Admission: EM | Admit: 2017-02-03 | Discharge: 2017-02-03 | Disposition: A | Discharge status: Home / Self Care | Payer: Medicare PPO | Attending: Emergency Medicine | Admitting: Emergency Medicine

## 2017-02-03 DIAGNOSIS — J45909 Unspecified asthma, uncomplicated: Secondary | ICD-10-CM | POA: Insufficient documentation

## 2017-02-03 DIAGNOSIS — N390 Urinary tract infection, site not specified: Secondary | ICD-10-CM | POA: Insufficient documentation

## 2017-02-03 DIAGNOSIS — Z79899 Other long term (current) drug therapy: Secondary | ICD-10-CM | POA: Insufficient documentation

## 2017-02-03 DIAGNOSIS — Z7901 Long term (current) use of anticoagulants: Secondary | ICD-10-CM | POA: Insufficient documentation

## 2017-02-03 DIAGNOSIS — R300 Dysuria: Secondary | ICD-10-CM | POA: Diagnosis present

## 2017-02-03 LAB — URINALYSIS, COMPLETE (UACMP) WITH MICROSCOPIC
BACTERIA UA: NONE SEEN
Bilirubin Urine: NEGATIVE
GLUCOSE, UA: NEGATIVE mg/dL
KETONES UR: NEGATIVE mg/dL
Nitrite: NEGATIVE
PH: 7 (ref 5.0–8.0)
PROTEIN: NEGATIVE mg/dL
Specific Gravity, Urine: 1.006 (ref 1.005–1.030)

## 2017-02-03 LAB — CBC
HEMATOCRIT: 41.1 % (ref 35.0–47.0)
Hemoglobin: 14.3 g/dL (ref 12.0–16.0)
MCH: 33 pg (ref 26.0–34.0)
MCHC: 34.9 g/dL (ref 32.0–36.0)
MCV: 94.7 fL (ref 80.0–100.0)
PLATELETS: 176 10*3/uL (ref 150–440)
RBC: 4.34 MIL/uL (ref 3.80–5.20)
RDW: 14 % (ref 11.5–14.5)
WBC: 4.9 10*3/uL (ref 3.6–11.0)

## 2017-02-03 LAB — COMPREHENSIVE METABOLIC PANEL
ALBUMIN: 4.3 g/dL (ref 3.5–5.0)
ALT: 16 U/L (ref 14–54)
AST: 22 U/L (ref 15–41)
Alkaline Phosphatase: 59 U/L (ref 38–126)
Anion gap: 4 — ABNORMAL LOW (ref 5–15)
BUN: 15 mg/dL (ref 6–20)
CHLORIDE: 105 mmol/L (ref 101–111)
CO2: 30 mmol/L (ref 22–32)
CREATININE: 0.62 mg/dL (ref 0.44–1.00)
Calcium: 9.5 mg/dL (ref 8.9–10.3)
GFR calc Af Amer: >60 mL/min (ref >60)
GFR calc non Af Amer: >60 mL/min (ref >60)
GLUCOSE: 90 mg/dL (ref 65–99)
POTASSIUM: 4.1 mmol/L (ref 3.5–5.1)
SODIUM: 139 mmol/L (ref 135–145)
Total Bilirubin: 0.8 mg/dL (ref 0.3–1.2)
Total Protein: 7.5 g/dL (ref 6.5–8.1)

## 2017-02-03 LAB — LIPASE, BLOOD: LIPASE: 25 U/L (ref 11–51)

## 2017-02-03 MED ORDER — SULFAMETHOXAZOLE-TRIMETHOPRIM 800-160 MG PO TABS: 1.0000 | ORAL_TABLET | Freq: Two times a day (BID) | ORAL | 0 refills | Status: DC

## 2017-02-03 MED ORDER — PHENAZOPYRIDINE HCL 200 MG PO TABS: 200.0000 mg | ORAL_TABLET | Freq: Three times a day (TID) | ORAL | 0 refills | Status: DC | PRN

## 2017-02-03 NOTE — ED Provider Notes (Signed)
[facility] Emergency Department Provider Note   ____________________________________________   First MD Initiated Contact with Patient 02/03/17 1111     (approximate)  I have reviewed the triage vital signs and the nursing notes.   HISTORY  Chief Complaint Dysuria and Pelvic Pain   HPI Alisha Hernandez is a 76 y.o. female c/o urinary freq, urgency and dysuria. Pelvic pressure no pain. No fever but felt chilled started last night. Has nat had a uti for some time but this is what  It feels like.    Past Medical History:  Diagnosis Date  . Anxiety   . Asthma   . Atrial fibrillation (HCC)   . C. difficile colitis   . Heart murmur   . Hypercholesteremia   . Osteoporosis   . Pseudomembranous colitis     Patient Active Problem List   Diagnosis Date Noted  . Lichen sclerosus 12/24/2016  . Monoclonal B-cell lymphocytosis of unknown significance 03/11/2016  . Visit for screening mammogram 02/23/2016  . Bright red rectal bleeding 02/23/2016  . Urinary frequency 02/23/2016  . Breast lump on right side at 11 o'clock position 02/23/2016  . Stool guaiac positive 04/19/2015  . Fatigue 04/18/2015  . Anticoagulant long-term use 04/18/2015  . Diarrhea 04/18/2015  . Hyperalbuminemia 03/18/2015  . Dizziness 03/16/2015  . Medication monitoring encounter 03/16/2015  . Hyperreflexia of lower extremity 03/16/2015  . Pseudomembranous colitis 02/18/2015  . Osteoporosis 02/18/2015  . Asthma 02/18/2015  . HLD (hyperlipidemia) 02/25/2014  . APC (atrial premature contractions) 02/25/2014  . Anxiety 02/25/2014  . A-fib (HCC) 06/30/2012  . Airway hyperreactivity 06/30/2012    Past Surgical History:  Procedure Laterality Date  . ABDOMINAL HYSTERECTOMY    . NASAL SINUS SURGERY      Prior to Admission medications   Medication Sig Start Date End Date Taking? Authorizing Provider  ADVAIR DISKUS 250-50 MCG/DOSE AEPB Inhale 1 puff into the lungs daily.  04/01/15    [provider]  albuterol (PROVENTIL HFA;VENTOLIN HFA) 108 (90 BASE) MCG/ACT inhaler Inhale 2 puffs into the lungs every 4 (four) hours as needed for wheezing or shortness of breath.    [provider]  ALPRAZolam Prudy Feeler) 0.25 MG tablet Take 0.25 mg by mouth at bedtime as needed. 10/07/12   [provider]  Ascorbic Acid (VITAMIN C) 100 MG tablet Take 100 mg by mouth daily.    [provider]  CARTIA XT 120 MG 24 hr capsule Take 120 mg by mouth daily. 10/11/16   [provider]  cholecalciferol (VITAMIN D) 1000 UNITS tablet Take 1,000 Units by mouth daily.    [provider]  diltiazem (CARDIZEM) 60 MG tablet Take 60 mg by mouth as needed. If heart starts racing 09/27/16   [provider]  Multiple Vitamin (MULTIVITAMIN) tablet Take 1 tablet by mouth daily.    [provider]  Multiple Vitamins-Minerals (PRESERVISION AREDS 2 PO) Take by mouth.    [provider]  nitroGLYCERIN (NITROSTAT) 0.4 MG SL tablet Place 1 tablet under the tongue every 5 (five) minutes as needed. For chest pain 05/06/14 03/12/16  [provider]  phenazopyridine (PYRIDIUM) 200 MG tablet Take 1 tablet (200 mg total) by mouth 3 (three) times daily as needed for pain. 02/03/17 02/03/18  Arnaldo Natal, MD  rivaroxaban (XARELTO) 20 MG TABS tablet Take 20 mg by mouth daily with supper. Reported on 07/27/2015    [provider]  sertraline (ZOLOFT) 50 MG tablet TAKE 1 TABLET  EVERY DAY 03/15/16   Kerman Passey, MD  simvastatin (ZOCOR) 10 MG tablet Take 1 tablet by mouth at bedtime. 01/18/17   [provider]  simvastatin (ZOCOR) 20 MG tablet Take 20 mg by mouth daily.    [provider]  sulfamethoxazole-trimethoprim (BACTRIM DS,SEPTRA DS) 800-160 MG tablet Take 1 tablet by mouth 2 (two) times daily. 02/03/17   Arnaldo Natal, MD  triamcinolone ointment (KENALOG) 0.1 % Apply 1 application topically 2 (two) times daily. Apply  BID x 2 weeks, HS x 2 weeks, M-W-F x 2 weeks 12/25/16   Conard Novak, MD    Allergies Penicillin g and Levofloxacin  Family History  Problem Relation Age of Onset  . Heart attack Mother   . Heart disease Mother   . Cancer Mother        breast  . Hypertension Mother   . Breast cancer Mother 33  . Cancer Father        skin and prostate  . Cancer Sister        lymphoma  . Diabetes Sister   . Stroke Sister   . Diabetes Brother   . COPD Neg Hx     Social History Social History  Substance Use Topics  . Smoking status: Never Smoker  . Smokeless tobacco: Never Used  . Alcohol use No    Review of Systems  Constitutional: No fever +chills Eyes: No visual changes. ENT: No sore throat. Cardiovascular: Denies chest pain. Respiratory: Denies shortness of breath. Gastrointestinal: No abdominal pain.  No nausea, no vomiting.  No diarrhea.  No constipation.feels pressure suprapubically Genitourinary: dysuria. Musculoskeletal: Negative for back pain. Skin: Negative for rash. Neurological: Negative for headaches, focal weakness  ____________________________________________   PHYSICAL EXAM:  VITAL SIGNS: ED Triage Vitals  Enc Vitals Group     BP 02/03/17 1030 139/79     Pulse Rate 02/03/17 1030 70     Resp 02/03/17 1030 16     Temp 02/03/17 1030 98.7 F (37.1 C)     Temp Source 02/03/17 1030 Oral     SpO2 02/03/17 1030 98 %     Weight 02/03/17 1027 138 lb (62.6 kg)     Height 02/03/17 1027 5\' 5"  (1.651 m)     Head Circumference --      Peak Flow --      Pain Score 02/03/17 1027 8     Pain Loc --      Pain Edu? --      Excl. in GC? --     Constitutional: Alert and oriented. Well appearing and in no acute distress. Eyes: Conjunctivae are normal.  Head: Atraumatic. Nose: No congestion/rhinnorhea. Mouth/Throat: Mucous membranes are moist.  Oropharynx non-erythematous. Neck: No stridor. Cardiovascular: Normal rate, regular rhythm. Grossly normal heart sounds.   Good peripheral circulation. Respiratory: Normal respiratory effort.  No retractions. Lungs CTAB. Gastrointestinal: Soft and nontender. No distention. No abdominal bruits. No CVA tenderness.palp supra pubically makes her feel need to urinate. No pain though. Musculoskeletal: No lower extremity tenderness nor edema.   Neurologic:  Normal speech and language. No gross focal neurologic deficits are appreciated. . Skin:  Skin is warm, dry and intact. No rash noted. Psychiatric: Mood and affect are normal. Speech and behavior are normal.  ____________________________________________   LABS (all labs ordered are listed, but only abnormal results are displayed)  Labs Reviewed  COMPREHENSIVE METABOLIC PANEL - Abnormal; Notable for the following:       Result Value  Anion gap 4 (*)    All other components within normal limits  URINALYSIS, COMPLETE (UACMP) WITH MICROSCOPIC - Abnormal; Notable for the following:    Color, Urine STRAW (*)    APPearance CLEAR (*)    Hgb urine dipstick SMALL (*)    Leukocytes, UA MODERATE (*)    Squamous Epithelial / LPF 0-5 (*)    All other components within normal limits  LIPASE, BLOOD  CBC   ____________________________________________  EKG   ____________________________________________  RADIOLOGY   ____________________________________________   PROCEDURES  Procedure(s) performed:  Procedures  Critical Care performed:  ____________________________________________   INITIAL IMPRESSION / ASSESSMENT AND PLAN / ED COURSE  Pertinent labs & imaging results that were available during my care of the patient were reviewed by me and considered in my medical decision making (see chart for details).       ____________________________________________   FINAL CLINICAL IMPRESSION(S) / ED DIAGNOSES  Final diagnoses:  Lower urinary tract infectious disease      NEW MEDICATIONS STARTED DURING THIS VISIT:  New Prescriptions    PHENAZOPYRIDINE (PYRIDIUM) 200 MG TABLET    Take 1 tablet (200 mg total) by mouth 3 (three) times daily as needed for pain.   SULFAMETHOXAZOLE-TRIMETHOPRIM (BACTRIM DS,SEPTRA DS) 800-160 MG TABLET    Take 1 tablet by mouth 2 (two) times daily.     Note:  This document was prepared using Dragon voice recognition software and may include unintentional dictation errors.    Arnaldo NatalMalinda, Paul F, MD 02/03/17 1146

## 2017-02-03 NOTE — Discharge Instructions (Signed)
Take the Pyridium one pill 3 times a day. Be careful it will make your urine orange and can stain. It should make the burning get better Take 1 pill 3 times a day.I will give you Bactrim,an antibiotic, to take twice a day for 5 days. Save the rest of the pills to use if you have another UTI on your trip. Return or see your doctor for fever, vomiting, worse pain, pain in the kidney area or are not better in 2-3 days.

## 2017-02-03 NOTE — ED Triage Notes (Signed)
C/O dysuria.  C/O burning and frequency with urination.  Onset of symptoms this morning.  Also c/o pelvic pressure.

## 2017-02-03 NOTE — ED Notes (Signed)
FIRST NURSE NOTE:   pain with urination since the middle of the night last night, pt ambulatory in the lobby without difficulty, accompanied by husband.

## 2017-02-05 ENCOUNTER — Ambulatory Visit: Payer: Medicare PPO

## 2017-02-14 ENCOUNTER — Telehealth: Payer: Self-pay

## 2017-02-14 NOTE — Telephone Encounter (Signed)
Pt states she was still trying to figure if she still needs to get the US pelvis transvaginal imaging done. She saw Dr. Almon HerculesJackson [place]  ( GYN ) and she was prescribed  Kenalog 0.1% cream and she states it felt better. I stated to pt that the imaging was order due to having pelvic pressure in bladder area. I suggest since her US apt ios not unitl 27 of September she can ask Dr. Sherie DonLada any questions or concerns she has regarding the  Imaging. She states she would like to talk to DR. Jean RosenthalJackson Monday at her apt and she will go from there.

## 2017-02-18 ENCOUNTER — Ambulatory Visit (INDEPENDENT_AMBULATORY_CARE_PROVIDER_SITE_OTHER): Payer: Medicare PPO | Admitting: Obstetrics and Gynecology

## 2017-02-18 ENCOUNTER — Encounter: Payer: Self-pay | Admitting: Obstetrics and Gynecology

## 2017-02-18 VITALS — BP 124/78 | Wt 141.0 lb

## 2017-02-18 DIAGNOSIS — L900 Lichen sclerosus et atrophicus: Secondary | ICD-10-CM

## 2017-02-18 DIAGNOSIS — N993 Prolapse of vaginal vault after hysterectomy: Secondary | ICD-10-CM

## 2017-02-18 NOTE — Progress Notes (Signed)
Obstetrics & Gynecology Office Visit   Chief Complaint  Patient presents with  . Follow-up  vulvar itching, lichen sclerosis  History of Present Illness: 76 y.o. G37P2002 female whe presents in follow up to assess response to topical steroid treatment since July for presumed lichen sclerosis. She states that her itching symptoms have improved.  In the mean time she had what she describes as either a bladder infection or a kidney infection.  She had no side effects of the medication and tolerated it well. She did have a question of whether she had prolapse. This was evaluated by Dr. Sherie Don, who did not believe she had significant prolapse. She states she is asymptomatic from any prolapse symptoms today. Denies bulging symptoms, incontinence symptoms, and frequent UTIs.  Past Medical History:  Diagnosis Date  . Anxiety   . Asthma   . Atrial fibrillation (HCC)   . C. difficile colitis   . Heart murmur   . Hypercholesteremia   . Osteoporosis   . Pseudomembranous colitis     Past Surgical History:  Procedure Laterality Date  . ABDOMINAL HYSTERECTOMY    . NASAL SINUS SURGERY      Gynecologic History: No LMP recorded. Patient has had a hysterectomy.  Obstetric History: [id number]  Family History  Problem Relation Age of Onset  . Heart attack Mother   . Heart disease Mother   . Cancer Mother        breast  . Hypertension Mother   . Breast cancer Mother 58  . Cancer Father        skin and prostate  . Cancer Sister        lymphoma  . Diabetes Sister   . Stroke Sister   . Diabetes Brother   . COPD Neg Hx     Social History   Social History  . Marital status: Married    Spouse name: N/A  . Number of children: N/A  . Years of education: N/A   Occupational History  . Not on file.   Social History Main Topics  . Smoking status: Never Smoker  . Smokeless tobacco: Never Used  . Alcohol use No  . Drug use: No  . Sexual activity: Not Currently    Birth control/  protection: Post-menopausal   Other Topics Concern  . Not on file   Social History Narrative  . No narrative on file    Allergies  Allergen Reactions  . Penicillin G Other (See Comments)    Pt. Can't remember the side effect, but has always not taken it.  . Levofloxacin Nausea And Vomiting    racing heart    Prior to Admission medications   Medication Sig Start Date End Date Taking? Authorizing Provider  ADVAIR DISKUS 250-50 MCG/DOSE AEPB Inhale 1 puff into the lungs daily.  04/01/15   [provider]  albuterol (PROVENTIL HFA;VENTOLIN HFA) 108 (90 BASE) MCG/ACT inhaler Inhale 2 puffs into the lungs every 4 (four) hours as needed for wheezing or shortness of breath.    [provider]  ALPRAZolam Prudy Feeler) 0.25 MG tablet Take 0.25 mg by mouth at bedtime as needed. 10/07/12   [provider]  Ascorbic Acid (VITAMIN C) 100 MG tablet Take 100 mg by mouth daily.    [provider]  CARTIA XT 120 MG 24 hr capsule Take 120 mg by mouth daily. 10/11/16   [provider]  cholecalciferol (VITAMIN D) 1000 UNITS tablet Take 1,000 Units by mouth daily.    [provider]  diltiazem (CARDIZEM) 60 MG tablet Take 60 mg by mouth as needed. If heart starts racing 09/27/16   [provider]  Multiple Vitamin (MULTIVITAMIN) tablet Take 1 tablet by mouth daily.    [provider]  Multiple Vitamins-Minerals (PRESERVISION AREDS 2 PO) Take by mouth.    [provider]  nitroGLYCERIN (NITROSTAT) 0.4 MG SL tablet Place 1 tablet under the tongue every 5 (five) minutes as needed. For chest pain 05/06/14 03/12/16  [provider]  phenazopyridine (PYRIDIUM) 200 MG tablet Take 1 tablet (200 mg total) by mouth 3 (three) times daily as needed for pain. 02/03/17 02/03/18  Arnaldo Natal, MD  rivaroxaban (XARELTO) 20 MG TABS tablet Take 20 mg by mouth daily with supper. Reported on 07/27/2015    [provider]  sertraline (ZOLOFT)  50 MG tablet TAKE 1 TABLET EVERY DAY 03/15/16   Lada, Janit Bern, MD  simvastatin (ZOCOR) 10 MG tablet Take 1 tablet by mouth at bedtime. 01/18/17   [provider]  simvastatin (ZOCOR) 20 MG tablet Take 20 mg by mouth daily.    [provider]  sulfamethoxazole-trimethoprim (BACTRIM DS,SEPTRA DS) 800-160 MG tablet Take 1 tablet by mouth 2 (two) times daily. 02/03/17   Arnaldo Natal, MD  triamcinolone ointment (KENALOG) 0.1 % Apply 1 application topically 2 (two) times daily. Apply BID x 2 weeks, HS x 2 weeks, M-W-F x 2 weeks 12/25/16   Conard Novak, MD    Review of Systems  Constitutional: Negative.   Cardiovascular: Negative.   Gastrointestinal: Negative.   Genitourinary: Negative.   Skin: Positive for rash (per HPI). Negative for itching (per HPI).     Physical Exam BP 124/78   Wt 141 lb (64 kg)   BMI 23.46 kg/m  No LMP recorded. Patient has had a hysterectomy. Physical Exam  Constitutional: She is oriented to person, place, and time. She appears well-developed and well-nourished. No distress.  Genitourinary: Vagina normal. Pelvic exam was performed with patient supine.    Vagina exhibits no lesion. No bleeding in the vagina. No signs of injury around the vagina. Right adnexum does not display mass, does not display tenderness and does not display fullness. Left adnexum does not display mass, does not display tenderness and does not display fullness.   POP-Q measurements were:  Aa: 0, Ba: 0, C: x gh: 4, pb: 3, TVL: 8 Ap: 0, Bp: 0, D: -4 Genitourinary Comments: Interpretation of POP-Q The anterior and posterior vaginal walls are at the level of the hymen. The vaginal cuff is 4 cm inside the hymen with valsalva. Supine stress test negative  HENT:  Head: Normocephalic and atraumatic.  Cardiovascular: Normal rate and regular rhythm.   Pulmonary/Chest: Effort normal and breath sounds normal.  Abdominal: Soft. Bowel sounds are normal.  Neurological: She is  alert and oriented to person, place, and time.  Psychiatric: She has a normal mood and affect. Her behavior is normal. Judgment normal.   Female chaperone present for pelvic and breast  portions of the physical exam  Assessment: 76 y.o. G80P2002 female with lichen sclerosis.  She has improved on the medication.  Plan: Problem List Items Addressed This Visit    Lichen sclerosus - Primary (Chronic)   Vaginal vault prolapse after hysterectomy     Recommend follow up in 4-6 months to reassess need for another course of the medication.  If necessary use the following treatment: - treat with triamcinolone 0.1% as follows:             -  BID x 2 weeks             - HS x 2 weeks             - M-W-F HS x 2 weeks She does have asymptomatic pelvic organ prolapse, stage 2.  Given her lack of symptoms, we discussed monitoring for now.  She readily agreed to this approach. Will follow up, as needed.  If she develops recurrent UTIs, may be a good candidate for topical estrogen therapy. Discussed briefly.  Thomasene Mohair, MD 02/18/2017 3:16 PM    CC: Kerman Passey, MD [address] [place], [place] [address]

## 2017-02-21 ENCOUNTER — Encounter: Payer: Self-pay | Admitting: Family Medicine

## 2017-02-21 ENCOUNTER — Ambulatory Visit (INDEPENDENT_AMBULATORY_CARE_PROVIDER_SITE_OTHER): Payer: Medicare PPO | Admitting: Family Medicine

## 2017-02-21 VITALS — BP 128/70 | HR 77 | Temp 98.7°F | Resp 16 | Ht 65.0 in | Wt 139.3 lb

## 2017-02-21 DIAGNOSIS — H6981 Other specified disorders of Eustachian tube, right ear: Secondary | ICD-10-CM | POA: Diagnosis not present

## 2017-02-21 DIAGNOSIS — J069 Acute upper respiratory infection, unspecified: Secondary | ICD-10-CM

## 2017-02-21 MED ORDER — LEVOCETIRIZINE DIHYDROCHLORIDE 5 MG PO TABS: 2.5000 mg | ORAL_TABLET | Freq: Every evening | ORAL | 0 refills | Status: DC

## 2017-02-21 MED ORDER — PREDNISONE 10 MG PO TABS: 10.0000 mg | ORAL_TABLET | Freq: Every day | ORAL | 0 refills | Status: DC

## 2017-02-21 NOTE — Progress Notes (Addendum)
Name: Alisha Hernandez   MRN: [id number]    DOB: [birth year]   Date:02/21/2017       Progress Note  Subjective  Chief Complaint  Chief Complaint  Patient presents with  . Sinusitis    for 2 days  . Ear Pain    HPI  Patient presents with right sided ear pain x2 days; has asthma and is having mild shortness of breath, fatigue, and intermittent chills for the last two days as well.  No cough, sinus pain/pressure, no left ear pain/pressure, no headache, fevers or NVD.  She has not been using albuterol inhaler today, but is willing to use if she needs to. Does not use nasal sprays because of a history of mold in her nose.  Patient Active Problem List   Diagnosis Date Noted  . 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 Lichen sclerosus 12/24/2016  . Monoclonal B-cell lymphocytosis of unknown significance 03/11/2016  . Visit for screening mammogram 02/23/2016  . Bright red rectal bleeding 02/23/2016  . Urinary frequency 02/23/2016  . Breast lump on right side at 11 o'clock position 02/23/2016  . Stool guaiac positive 04/19/2015  . Fatigue 04/18/2015  . Anticoagulant long-term use 04/18/2015  . Diarrhea 04/18/2015  . Hyperalbuminemia 03/18/2015  . Dizziness 03/16/2015  . Medication monitoring encounter 03/16/2015  . Hyperreflexia of lower extremity 03/16/2015  . Pseudomembranous colitis 02/18/2015  . Osteoporosis 02/18/2015  . Asthma 02/18/2015  . HLD (hyperlipidemia) 02/25/2014  . APC (atrial premature contractions) 02/25/2014  . Anxiety 02/25/2014  . A-fib (HCC) 06/30/2012  . Airway hyperreactivity 06/30/2012    Social History  Substance Use Topics  . Smoking status: Never Smoker  . Smokeless tobacco: Never Used  . Alcohol use No     Current Outpatient Prescriptions:  .  ADVAIR DISKUS 250-50 MCG/DOSE AEPB, Inhale 1 puff into the lungs daily. , Disp: , Rfl:  .  albuterol (PROVENTIL HFA;VENTOLIN HFA) 108 (90 BASE) MCG/ACT inhaler, Inhale 2 puffs into the lungs  every 4 (four) hours as needed for wheezing or shortness of breath., Disp: , Rfl:  .  ALPRAZolam (XANAX) 0.25 MG tablet, Take 0.25 mg by mouth at bedtime as needed., Disp: , Rfl:  .  Ascorbic Acid (VITAMIN C) 100 MG tablet, Take 100 mg by mouth daily., Disp: , Rfl:  .  CARTIA XT 120 MG 24 hr capsule, Take 120 mg by mouth daily., Disp: , Rfl:  .  cholecalciferol (VITAMIN D) 1000 UNITS tablet, Take 1,000 Units by mouth daily., Disp: , Rfl:  .  diltiazem (CARDIZEM) 60 MG tablet, Take 60 mg by mouth as needed. If heart starts racing, Disp: , Rfl:  .  Multiple Vitamin (MULTIVITAMIN) tablet, Take 1 tablet by mouth daily., Disp: , Rfl:  .  Multiple Vitamins-Minerals (PRESERVISION AREDS 2 PO), Take by mouth., Disp: , Rfl:  .  rivaroxaban (XARELTO) 20 MG TABS tablet, Take 20 mg by mouth daily with supper. Reported on 07/27/2015, Disp: , Rfl:  .  sertraline (ZOLOFT) 50 MG tablet, TAKE 1 TABLET EVERY DAY, Disp: 90 tablet, Rfl: 3 .  simvastatin (ZOCOR) 10 MG tablet, Take 1 tablet by mouth at bedtime., Disp: , Rfl:  .  simvastatin (ZOCOR) 20 MG tablet, Take 20 mg by mouth daily., Disp: , Rfl:  .  triamcinolone ointment (KENALOG) 0.1 %, Apply 1 application topically 2 (two) times daily. Apply BID x 2 weeks, HS x 2 weeks, M-W-F x 2 weeks, Disp: 30 g, Rfl: 0 .  nitroGLYCERIN (NITROSTAT)  0.4 MG SL tablet, Place 1 tablet under the tongue every 5 (five) minutes as needed. For chest pain, Disp: , Rfl:  .  phenazopyridine (PYRIDIUM) 200 MG tablet, Take 1 tablet (200 mg total) by mouth 3 (three) times daily as needed for pain. (Patient not taking: Reported on 02/21/2017), Disp: 9 tablet, Rfl: 0 .  sulfamethoxazole-trimethoprim (BACTRIM DS,SEPTRA DS) 800-160 MG tablet, Take 1 tablet by mouth 2 (two) times daily. (Patient not taking: Reported on 02/21/2017), Disp: 20 tablet, Rfl: 0  Allergies  Allergen Reactions  . Penicillin G Other (See Comments)    Pt. Can't remember the side effect, but has always not taken it.  .  Levofloxacin Nausea And Vomiting    racing heart    ROS  Ten systems reviewed and is negative except as mentioned in HPI  Objective  Vitals:   02/21/17 1445  BP: 128/70  Pulse: 77  Resp: 16  Temp: 98.7 F (37.1 C)  TempSrc: Oral  SpO2: 94%  Weight: 139 lb 4.8 oz (63.2 kg)  Height:  (1.651 m)   Body mass index is 23.18 kg/m.  Nursing Note and Vital Signs reviewed.  Physical Exam  Constitutional: Patient appears well-developed and well-nourished.  No distress.  HEENT: head atraumatic, normocephalic, pupils equal and reactive to light, EOM's intact, TM's without erythema or bulging, no maxillary or frontal sinus pain on palpation, neck supple with mild right-sided anterior cervical lymphadenopathy, oropharynx pink and moist without exudate Cardiovascular: Normal rate, regular rhythm, S1/S2 present.  No murmur or rub heard. No BLE edema. Pulmonary/Chest: Effort normal and breath sounds clear without wheezing, rhonchi or rhales. No respiratory distress or retractions. Psychiatric: Patient has a normal mood and affect. behavior is normal. Judgment and thought content normal.  Assessment & Plan  1. Dysfunction of right eustachian tube - levocetirizine (XYZAL) 5 MG tablet; Take 0.5 tablets (2.5 mg total) by mouth every evening.  Dispense: 30 tablet; Refill: 0 - predniSONE (DELTASONE) 10 MG tablet; Take 1 tablet (10 mg total) by mouth daily with breakfast. Day1:5tabs, Day2:4tabs, Day3:3tabs, Day4:2tabs, Day5:1tab  Dispense: 15 tablet; Refill: 0  2. Viral upper respiratory tract infection - predniSONE (DELTASONE) 10 MG tablet; Take 1 tablet (10 mg total) by mouth daily with breakfast. Day1:5tabs, Day2:4tabs, Day3:3tabs, Day4:2tabs, Day5:1tab  Dispense: 15 tablet; Refill: 0  - Patient declines flonase. Advised this is very unlikely to be sinusitis this early in her illness and due to her lack of sinus pain and pressure. We will treat for inflammation in the eustachian tube and  patient will return if not improving and/or worsening. - Recommended she use albuterol inhaler as needed for shortness of breath as it is prescribed.  -Red flags and when to present for emergency care or RTC including fever >101.48F, chest pain, shortness of breath, new/worsening/un-resolving symptoms, reviewed with patient at time of visit. Follow up and care instructions discussed and provided in AVS.  I have reviewed this encounter including the documentation in this note and/or discussed this patient with the Deboraha Sprang, FNP, NP-C. I am certifying that I agree with the content of this note as supervising physician.  Alba Cory, MD [facility] 02/24/2017, 3:34 PM

## 2017-02-21 NOTE — Patient Instructions (Addendum)
Use your albuterol inhaler (proair) only as needed for shortness of breath.  Take Xyzal at night and take prednisone in the mornings.  Please make sure you follow the directions on your prednisone prescription bottle.

## 2017-02-28 ENCOUNTER — Ambulatory Visit: Payer: Medicare PPO

## 2017-03-06 ENCOUNTER — Other Ambulatory Visit: Payer: Self-pay | Admitting: Family Medicine

## 2017-03-12 ENCOUNTER — Inpatient Hospital Stay: Payer: Medicare PPO | Attending: Oncology

## 2017-03-12 DIAGNOSIS — E7800 Pure hypercholesterolemia, unspecified: Secondary | ICD-10-CM | POA: Insufficient documentation

## 2017-03-12 DIAGNOSIS — N6310 Unspecified lump in the right breast, unspecified quadrant: Secondary | ICD-10-CM | POA: Insufficient documentation

## 2017-03-12 DIAGNOSIS — Z808 Family history of malignant neoplasm of other organs or systems: Secondary | ICD-10-CM | POA: Diagnosis not present

## 2017-03-12 DIAGNOSIS — I4891 Unspecified atrial fibrillation: Secondary | ICD-10-CM | POA: Insufficient documentation

## 2017-03-12 DIAGNOSIS — Z8042 Family history of malignant neoplasm of prostate: Secondary | ICD-10-CM | POA: Insufficient documentation

## 2017-03-12 DIAGNOSIS — J45909 Unspecified asthma, uncomplicated: Secondary | ICD-10-CM | POA: Diagnosis not present

## 2017-03-12 DIAGNOSIS — D72820 Lymphocytosis (symptomatic): Secondary | ICD-10-CM | POA: Diagnosis not present

## 2017-03-12 DIAGNOSIS — Z7901 Long term (current) use of anticoagulants: Secondary | ICD-10-CM | POA: Diagnosis not present

## 2017-03-12 DIAGNOSIS — Z880 Allergy status to penicillin: Secondary | ICD-10-CM | POA: Diagnosis not present

## 2017-03-12 DIAGNOSIS — Z803 Family history of malignant neoplasm of breast: Secondary | ICD-10-CM | POA: Diagnosis not present

## 2017-03-12 DIAGNOSIS — Z79899 Other long term (current) drug therapy: Secondary | ICD-10-CM | POA: Insufficient documentation

## 2017-03-12 DIAGNOSIS — D72819 Decreased white blood cell count, unspecified: Secondary | ICD-10-CM

## 2017-03-12 LAB — CBC WITH DIFFERENTIAL/PLATELET
Basophils Absolute: 0 10*3/uL (ref 0–0.1)
Basophils Relative: 1 %
EOS PCT: 2 %
Eosinophils Absolute: 0.1 10*3/uL (ref 0–0.7)
HCT: 39.4 % (ref 35.0–47.0)
Hemoglobin: 13.2 g/dL (ref 12.0–16.0)
LYMPHS ABS: 1.2 10*3/uL (ref 1.0–3.6)
LYMPHS PCT: 37 %
MCH: 32.2 pg (ref 26.0–34.0)
MCHC: 33.6 g/dL (ref 32.0–36.0)
MCV: 95.8 fL (ref 80.0–100.0)
MONO ABS: 0.4 10*3/uL (ref 0.2–0.9)
MONOS PCT: 12 %
Neutro Abs: 1.7 10*3/uL (ref 1.4–6.5)
Neutrophils Relative %: 48 %
PLATELETS: 193 10*3/uL (ref 150–440)
RBC: 4.11 MIL/uL (ref 3.80–5.20)
RDW: 13.4 % (ref 11.5–14.5)
WBC: 3.4 10*3/uL — ABNORMAL LOW (ref 3.6–11.0)

## 2017-03-14 LAB — COMP PANEL: LEUKEMIA/LYMPHOMA: Immunophenotypic Profile: 3

## 2017-03-23 NOTE — Progress Notes (Signed)
[facility]  Telephone:(336) [phone] Fax:(336) [phone]  ID: Alisha Hernandez OB: Feb 23, 1941  MR#: [id number]  RKY#:[id number]  Patient Care Team: Arnetha Courser, MD as PCP - General (Family Medicine)  CHIEF COMPLAINT: Monoclonal B-cell lymphocytosis of unknown significance.  INTERVAL HISTORY: Patient returns to clinic today for routine yearly evaluation and discussion of her laboratory work. She continues to feel well and is asymptomatic. She reports a right breast lump on self exam. She denies any fevers or illnesses. She has a good appetite and denies weight loss. She has no neurologic complaints. She denies any chest pain or shortness of breath. She denies any nausea, vomiting, constipation, or diarrhea. She has no urinary complaints. Patient offers no further specific complaints today.   REVIEW OF SYSTEMS:   Review of Systems  Constitutional: Negative.  Negative for fever, malaise/fatigue and weight loss.  Respiratory: Negative.  Negative for cough and shortness of breath.   Cardiovascular: Negative.  Negative for chest pain and leg swelling.  Gastrointestinal: Negative.  Negative for abdominal pain.  Genitourinary: Negative.   Musculoskeletal: Negative.   Neurological: Negative.  Negative for weakness.  Endo/Heme/Allergies: Does not bruise/bleed easily.  Psychiatric/Behavioral: Negative.  The patient is not nervous/anxious.     As per HPI. Otherwise, a complete review of systems is negative.  PAST MEDICAL HISTORY: Past Medical History:  Diagnosis Date  . Anxiety   . Asthma   . Atrial fibrillation ([place])   . C. difficile colitis   . Heart murmur   . Hypercholesteremia   . Osteoporosis   . Pseudomembranous colitis     PAST SURGICAL HISTORY: Past Surgical History:  Procedure Laterality Date  . ABDOMINAL HYSTERECTOMY    . NASAL SINUS SURGERY      FAMILY HISTORY Family History  Problem Relation Age of Onset  . Heart attack Mother   . Heart disease  Mother   . Cancer Mother        breast  . Hypertension Mother   . Breast cancer Mother 5  . Cancer Father        skin and prostate  . Cancer Sister        lymphoma  . Diabetes Sister   . Stroke Sister   . Diabetes Brother   . COPD Neg Hx        ADVANCED DIRECTIVES:    HEALTH MAINTENANCE: Social History  Substance Use Topics  . Smoking status: Never Smoker  . Smokeless tobacco: Never Used  . Alcohol use No     Colonoscopy:  PAP:  Bone density:  Lipid panel:  Allergies  Allergen Reactions  . Penicillin G Other (See Comments)    Pt. Can't remember the side effect, but has always not taken it.  . Levofloxacin Nausea And Vomiting    racing heart    Current Outpatient Prescriptions  Medication Sig Dispense Refill  . ADVAIR DISKUS 250-50 MCG/DOSE AEPB Inhale 1 puff into the lungs daily.     Marland Kitchen albuterol (PROVENTIL HFA;VENTOLIN HFA) 108 (90 BASE) MCG/ACT inhaler Inhale 2 puffs into the lungs every 4 (four) hours as needed for wheezing or shortness of breath.    . ALPRAZolam (XANAX) 0.25 MG tablet Take 0.25 mg by mouth at bedtime as needed.    . Ascorbic Acid (VITAMIN C) 100 MG tablet Take 100 mg by mouth daily.    Marland Kitchen CARTIA XT 120 MG 24 hr capsule Take 120 mg by mouth daily.    . cholecalciferol (VITAMIN D) 1000 UNITS  tablet Take 1,000 Units by mouth daily.    Marland Kitchen diltiazem (CARDIZEM) 60 MG tablet Take 60 mg by mouth as needed. If heart starts racing    . levocetirizine (XYZAL) 5 MG tablet Take 0.5 tablets (2.5 mg total) by mouth every evening. 30 tablet 0  . Multiple Vitamin (MULTIVITAMIN) tablet Take 1 tablet by mouth daily.    . Multiple Vitamins-Minerals (PRESERVISION AREDS 2 PO) Take by mouth.    . predniSONE (DELTASONE) 10 MG tablet Take 1 tablet (10 mg total) by mouth daily with breakfast. Day1:5tabs, Day2:4tabs, Day3:3tabs, Day4:2tabs, Day5:1tab 15 tablet 0  . rivaroxaban (XARELTO) 20 MG TABS tablet Take 20 mg by mouth daily with supper. Reported on 07/27/2015    .  sertraline (ZOLOFT) 50 MG tablet Take 1 tablet (50 mg total) by mouth daily. 90 tablet 3  . simvastatin (ZOCOR) 10 MG tablet Take 1 tablet by mouth at bedtime.    . simvastatin (ZOCOR) 20 MG tablet Take 20 mg by mouth daily.    Marland Kitchen triamcinolone ointment (KENALOG) 0.1 % Apply 1 application topically 2 (two) times daily. Apply BID x 2 weeks, HS x 2 weeks, M-W-F x 2 weeks 30 g 0  . nitroGLYCERIN (NITROSTAT) 0.4 MG SL tablet Place 1 tablet under the tongue every 5 (five) minutes as needed. For chest pain     No current facility-administered medications for this visit.     OBJECTIVE: Vitals:   03/26/17 1453  BP: 114/68  Pulse: (!) 54  Resp: 18  Temp: (!) 97.5 F (36.4 C)     Body mass index is 22.88 kg/m.    ECOG FS:0 - Asymptomatic  General: Well-developed, well-nourished, no acute distress. Eyes: Pink conjunctiva, anicteric sclera. Breasts: Right breast with minimally palpable fibroglandular area at approximately 11:00. Lungs: Clear to auscultation bilaterally. Heart: Regular rate and rhythm. No rubs, murmurs, or gallops. Abdomen: Soft, nontender, nondistended. No organomegaly noted, normoactive bowel sounds. Musculoskeletal: No edema, cyanosis, or clubbing. Neuro: Alert, answering all questions appropriately. Cranial nerves grossly intact. Skin: No rashes or petechiae noted. Psych: Normal affect. Lymphatics: No cervical, calvicular, axillary or inguinal LAD.   LAB RESULTS:  Lab Results  Component Value Date   NA 139 02/03/2017   K 4.1 02/03/2017   CL 105 02/03/2017   CO2 30 02/03/2017   GLUCOSE 90 02/03/2017   BUN 15 02/03/2017   CREATININE 0.62 02/03/2017   CALCIUM 9.5 02/03/2017   PROT 7.5 02/03/2017   ALBUMIN 4.3 02/03/2017   AST 22 02/03/2017   ALT 16 02/03/2017   ALKPHOS 59 02/03/2017   BILITOT 0.8 02/03/2017   GFRNONAA >60 02/03/2017   GFRAA >60 02/03/2017    Lab Results  Component Value Date   WBC 3.4 (L) 03/12/2017   NEUTROABS 1.7 03/12/2017   HGB 13.2  03/12/2017   HCT 39.4 03/12/2017   MCV 95.8 03/12/2017   PLT 193 03/12/2017     STUDIES: No results found.  ASSESSMENT: Monoclonal B-cell lymphocytosis of unknown significance.  PLAN:    1. Monoclonal B-cell lymphocytosis of unknown significance: Report reviewed and noted to have a small clonal B cell population of approximately 3% of cells, which is unchanged from one year prior. This population was 2% 2 years ago and 1% of the population in 2015. This population is CD5 negative, CD10 negative with a kappa light chain restriction. No other significant immunophenotypic abnormalities were detected. This is of unclear clinical significance. No intervention is needed at this time. Because patient is asymptomatic, she  does not require bone marrow biopsy. Continue to monitor. Patient will return to clinic in 1 year for repeat evaluation.  2. Breast mass: Patient had a normal mammogram on March 26, 2016. Mass is minimally palpable and likely fibroglandular, but will order repeat mammogram in for further evaluation. If negative follow-up as above, if positive, will pursue biopsy and further evaluation.  Approximately 30 minutes was spent in discussion of which greater than 50% was consultation.  Patient expressed understanding and was in agreement with this plan. She also understands that She can call clinic at any time with any questions, concerns, or complaints.    Lloyd Huger, MD   03/26/2017 4:40 PM

## 2017-03-26 ENCOUNTER — Inpatient Hospital Stay (HOSPITAL_BASED_OUTPATIENT_CLINIC_OR_DEPARTMENT_OTHER): Payer: Medicare PPO | Admitting: Oncology

## 2017-03-26 VITALS — BP 114/68 | HR 54 | Temp 97.5°F | Resp 18 | Wt 137.5 lb

## 2017-03-26 DIAGNOSIS — D72820 Lymphocytosis (symptomatic): Secondary | ICD-10-CM | POA: Diagnosis not present

## 2017-03-26 DIAGNOSIS — E7800 Pure hypercholesterolemia, unspecified: Secondary | ICD-10-CM

## 2017-03-26 DIAGNOSIS — Z880 Allergy status to penicillin: Secondary | ICD-10-CM | POA: Diagnosis not present

## 2017-03-26 DIAGNOSIS — Z8042 Family history of malignant neoplasm of prostate: Secondary | ICD-10-CM | POA: Diagnosis not present

## 2017-03-26 DIAGNOSIS — Z808 Family history of malignant neoplasm of other organs or systems: Secondary | ICD-10-CM | POA: Diagnosis not present

## 2017-03-26 DIAGNOSIS — Z0000 Encounter for general adult medical examination without abnormal findings: Secondary | ICD-10-CM

## 2017-03-26 DIAGNOSIS — I4891 Unspecified atrial fibrillation: Secondary | ICD-10-CM

## 2017-03-26 DIAGNOSIS — J45909 Unspecified asthma, uncomplicated: Secondary | ICD-10-CM

## 2017-03-26 DIAGNOSIS — N6310 Unspecified lump in the right breast, unspecified quadrant: Secondary | ICD-10-CM | POA: Diagnosis not present

## 2017-03-26 DIAGNOSIS — Z7901 Long term (current) use of anticoagulants: Secondary | ICD-10-CM

## 2017-03-26 DIAGNOSIS — Z79899 Other long term (current) drug therapy: Secondary | ICD-10-CM | POA: Diagnosis not present

## 2017-03-26 DIAGNOSIS — Z803 Family history of malignant neoplasm of breast: Secondary | ICD-10-CM

## 2017-04-04 ENCOUNTER — Ambulatory Visit: Payer: Medicare PPO | Admitting: Family Medicine

## 2017-04-11 ENCOUNTER — Ambulatory Visit
Admission: RE | Admit: 2017-04-11 | Discharge: 2017-04-11 | Disposition: A | Discharge status: Home / Self Care | Payer: Medicare PPO | Source: Ambulatory Visit | Attending: Oncology | Admitting: Oncology

## 2017-04-11 DIAGNOSIS — Z0000 Encounter for general adult medical examination without abnormal findings: Secondary | ICD-10-CM | POA: Insufficient documentation

## 2017-04-11 DIAGNOSIS — Z1231 Encounter for screening mammogram for malignant neoplasm of breast: Secondary | ICD-10-CM | POA: Diagnosis not present

## 2017-05-14 ENCOUNTER — Other Ambulatory Visit: Payer: Self-pay

## 2017-05-14 MED ORDER — SIMVASTATIN 20 MG PO TABS: 20.0000 mg | ORAL_TABLET | Freq: Every day | ORAL | 1 refills | Status: DC

## 2017-05-14 MED ORDER — ATORVASTATIN CALCIUM 20 MG PO TABS: 20.0000 mg | ORAL_TABLET | Freq: Every day | ORAL | 1 refills | Status: DC

## 2017-05-14 MED ORDER — SERTRALINE HCL 50 MG PO TABS: 50.0000 mg | ORAL_TABLET | Freq: Every day | ORAL | 1 refills | Status: DC

## 2017-05-14 NOTE — Telephone Encounter (Signed)
90 day supply

## 2017-05-14 NOTE — Telephone Encounter (Signed)
Please send short supply to [facility]

## 2017-05-14 NOTE — Telephone Encounter (Signed)
[facility] instructed her to stop the simvastatin and start new Rx for statin locally, not wait until January

## 2017-05-14 NOTE — Telephone Encounter (Signed)
Please contact [facility] and CANCEL the simvastatin Rx Call patient and let her know I'd like to switch the statin to atorvastatin Thank you

## 2017-05-20 ENCOUNTER — Telehealth: Payer: Self-pay | Admitting: Family Medicine

## 2017-05-20 NOTE — Telephone Encounter (Signed)
Copied from CRM [phone]#[id number]. Topic: Quick Communication - See Telephone Encounter >> May 20, 2017  4:57 PM Terisa Starraylor, Brittany L wrote: CRM for notification. See Telephone encounter for:   05/20/17.  [facility] rx needs a verbal approval for ADVAIR & PROAIR. Fax number is [phone].

## 2017-05-21 NOTE — Telephone Encounter (Signed)
Dr. Sherie DonLada is not the perscriber of this medication, it has been denied

## 2017-05-21 NOTE — Telephone Encounter (Signed)
Pt notified, states she has an appt on Thursday and will discuss then, because she is no longer seeing pulm

## 2017-05-23 ENCOUNTER — Ambulatory Visit: Payer: Medicare PPO | Admitting: Family Medicine

## 2017-05-23 ENCOUNTER — Encounter: Payer: Self-pay | Admitting: Family Medicine

## 2017-05-23 VITALS — BP 118/74 | HR 100 | Temp 97.7°F | Ht 65.0 in | Wt 137.5 lb

## 2017-05-23 DIAGNOSIS — J45909 Unspecified asthma, uncomplicated: Secondary | ICD-10-CM

## 2017-05-23 DIAGNOSIS — D72820 Lymphocytosis (symptomatic): Secondary | ICD-10-CM | POA: Diagnosis not present

## 2017-05-23 DIAGNOSIS — F419 Anxiety disorder, unspecified: Secondary | ICD-10-CM

## 2017-05-23 DIAGNOSIS — Z7901 Long term (current) use of anticoagulants: Secondary | ICD-10-CM

## 2017-05-23 DIAGNOSIS — M810 Age-related osteoporosis without current pathological fracture: Secondary | ICD-10-CM | POA: Diagnosis not present

## 2017-05-23 DIAGNOSIS — E782 Mixed hyperlipidemia: Secondary | ICD-10-CM

## 2017-05-23 DIAGNOSIS — N6311 Unspecified lump in the right breast, upper outer quadrant: Secondary | ICD-10-CM

## 2017-05-23 LAB — LIPID PANEL
CHOL/HDL RATIO: 2 (calc) (ref <5.0)
Cholesterol: 147 mg/dL (ref <200)
HDL: 74 mg/dL (ref >50)
LDL Cholesterol (Calc): 59 mg/dL (calc)
NON-HDL CHOLESTEROL (CALC): 73 mg/dL (ref <130)
Triglycerides: 61 mg/dL (ref <150)

## 2017-05-23 MED ORDER — ATORVASTATIN CALCIUM 20 MG PO TABS: 20.0000 mg | ORAL_TABLET | Freq: Every day | ORAL | 3 refills | Status: DC

## 2017-05-23 MED ORDER — ADVAIR DISKUS 250-50 MCG/DOSE IN AEPB: 1.0000 | INHALATION_SPRAY | Freq: Two times a day (BID) | RESPIRATORY_TRACT | 3 refills | Status: DC

## 2017-05-23 MED ORDER — SERTRALINE HCL 50 MG PO TABS: 50.0000 mg | ORAL_TABLET | Freq: Every day | ORAL | 3 refills | Status: DC

## 2017-05-23 MED ORDER — ALBUTEROL SULFATE HFA 108 (90 BASE) MCG/ACT IN AERS: 2.0000 | INHALATION_SPRAY | RESPIRATORY_TRACT | 1 refills | Status: DC | PRN

## 2017-05-23 NOTE — Progress Notes (Signed)
BP 118/74 (BP Location: Left Arm, Patient Position: Sitting, Cuff Size: Normal)   Pulse 100   Temp 97.7 F (36.5 C) (Oral)   Ht 5\' 5"  (1.651 m)   Wt 137 lb 8 oz (62.4 kg)   SpO2 97%   BMI 22.88 kg/m    Subjective:    Patient ID: [name], female    DOB: May 15, 1941, 76 y.o.   MRN: [id number]  HPI: Alisha Hernandez is a 76 y.o. female  Chief Complaint  Patient presents with  . Follow-up    pt questions if she should resume vitamins, has stopped co-q10, and fish oil     HPI Patient is here for follow-up She is seeing a new cardiologist; Dr. Mellissa Kohut; BP there was 124/74; paroxysmal a-flutter; she is totally aware when that happens, but that has been better; line dancing was a little stressful; walks 4 miles a day on her fit bit; on Xarelto, no bleeding from nose, gums, urine, stool; off of beta-blocker and now on CCB  Hyperlipidemia; not many fatty meats; just occasional hot dog or sausage, just once a week; switched to atorvastatin  Asthma; spirometry done today, normal; breathing has been better; staying on the medicine; Advair twice a day and PRN Proair; she was going to [facility] to see the specialist there  Using the Xyzal and asked if okay to take every night; perfume is a trigger  Dr. Dossie Arbour filled out a paper for her in case she needs handicapped space; bad for asthma sometimes; when she gets bad with a flare she really needs it; never abuses it  Seeing Dr. Orlie Dakin for lymphocytosis, just watching, monoclonal B-cell lymphocytosis  She just had the CMP done in September 2018; glucose normal despite family members with diabetes  Osteoporosis; does not want medicine; walking and getting plenty of exercise; plenty of green leafy veggies and taking vit D 1000 iu daily  Negative mammo in Nov with Dr. Orlie Dakin  Depression screen [facility] 2/9 05/23/2017 01/29/2017 11/20/2016 02/23/2016  Decreased Interest 0 0 0 0  Down, Depressed, Hopeless 0 0 0 0  PHQ - 2 Score 0 0 0 0     Relevant past medical, surgical, family and social history reviewed Past Medical History:  Diagnosis Date  . Anxiety   . Asthma   . Atrial fibrillation (HCC)   . C. difficile colitis   . Heart murmur   . Hypercholesteremia   . Osteoporosis   . Pseudomembranous colitis    Past Surgical History:  Procedure Laterality Date  . ABDOMINAL HYSTERECTOMY    . NASAL SINUS SURGERY     Family History  Problem Relation Age of Onset  . Heart attack Mother   . Heart disease Mother   . Cancer Mother        breast  . Hypertension Mother   . Breast cancer Mother 1  . Cancer Father        skin and prostate  . Cancer Sister        lymphoma  . Diabetes Sister   . Stroke Sister   . Diabetes Brother   . COPD Neg Hx    Social History   Tobacco Use  . Smoking status: Never Smoker  . Smokeless tobacco: Never Used  Substance Use Topics  . Alcohol use: No  . Drug use: No    Interim medical history since last visit reviewed. Allergies and medications reviewed  Review of Systems Per HPI unless specifically indicated above  Objective:    BP 118/74 (BP Location: Left Arm, Patient Position: Sitting, Cuff Size: Normal)   Pulse 100   Temp 97.7 F (36.5 C) (Oral)   Ht 5\' 5"  (1.651 m)   Wt 137 lb 8 oz (62.4 kg)   SpO2 97%   BMI 22.88 kg/m   Wt Readings from Last 3 Encounters:  05/23/17 137 lb 8 oz (62.4 kg)  03/26/17 137 lb 8 oz (62.4 kg)  02/21/17 139 lb 4.8 oz (63.2 kg)    Physical Exam  Constitutional: She appears well-developed and well-nourished.  HENT:  Right Ear: Hearing, tympanic membrane, external ear and ear canal normal.  Left Ear: Hearing, tympanic membrane, external ear and ear canal normal.  Eyes: EOM are normal. No scleral icterus.  Cardiovascular: S1 normal and S2 normal. Bradycardia present.  Pulmonary/Chest: Effort normal. No respiratory distress.  (Patient said Dr. Orlie DakinFinnegan just did a breast exam and she had mammo in Nov)  Abdominal: Normal  appearance. She exhibits no distension.  Musculoskeletal: Normal range of motion. She exhibits no edema.  Lymphadenopathy:    She has no axillary adenopathy.  Neurological: She is alert. She displays no tremor.  Skin: Skin is warm and dry. No pallor.  Psychiatric: Her speech is normal and behavior is normal. Her mood appears not anxious. She does not exhibit a depressed mood.    Results for orders placed or performed in visit on 03/12/17  Comp panel: Leukemia/Lymphoma  Result Value Ref Range   PATH INTERP XXX-IMP Comment    ANNOTATION COMMENT IMP Comment    CLINICAL INFO Comment    Misc Source Comment    ASSESSMENT OF LEUKOCYTES Comment    % Viable Cells Comment    Immunophenotypic Profile 3% of total cells (Phenotype below)    ANALYSIS AND GATING STRATEGY Comment    IMMUNOPHENOTYPING STUDY Comment    PATHOLOGIST NAME Comment    COMMENT: Comment   CBC with Differential/Platelet  Result Value Ref Range   WBC 3.4 (L) 3.6 - 11.0 K/uL   RBC 4.11 3.80 - 5.20 MIL/uL   Hemoglobin 13.2 12.0 - 16.0 g/dL   HCT 08.639.4 57.835.0 - 46.947.0 %   MCV 95.8 80.0 - 100.0 fL   MCH 32.2 26.0 - 34.0 pg   MCHC 33.6 32.0 - 36.0 g/dL   RDW 62.913.4 52.811.5 - 41.314.5 %   Platelets 193 150 - 440 K/uL   Neutrophils Relative % 48 %   Neutro Abs 1.7 1.4 - 6.5 K/uL   Lymphocytes Relative 37 %   Lymphs Abs 1.2 1.0 - 3.6 K/uL   Monocytes Relative 12 %   Monocytes Absolute 0.4 0.2 - 0.9 K/uL   Eosinophils Relative 2 %   Eosinophils Absolute 0.1 0 - 0.7 K/uL   Basophils Relative 1 %   Basophils Absolute 0.0 0 - 0.1 K/uL      Assessment & Plan:   Problem List Items Addressed This Visit      Respiratory   Asthma - Primary    Spirometry today looked beautiful; continue Advair; refills given      Relevant Medications   ADVAIR DISKUS 250-50 MCG/DOSE AEPB   albuterol (PROVENTIL HFA;VENTOLIN HFA) 108 (90 Base) MCG/ACT inhaler   Other Relevant Orders   Spirometry with Graph     Musculoskeletal and Integument    Osteoporosis    Check DEXA scan; adequate calcium and vit D and walking; practice good fall precautions      Relevant Orders   DG Bone  Density     Other   Monoclonal B-cell lymphocytosis of unknown significance    Managed by Dr. Orlie DakinFinnegan      HLD (hyperlipidemia)    Order fasting lipids; limit fatty meats; continue new statin      Relevant Medications   atorvastatin (LIPITOR) 20 MG tablet   Other Relevant Orders   Lipid panel   Breast lump on right side at 11 o'clock position    Checked by Dr. Orlie DakinFinnegan, recent mammo in Nov was negative      Anxiety    Continue sertraline      Relevant Medications   sertraline (ZOLOFT) 50 MG tablet   Anticoagulant long-term use    Not having any bleeding          Follow up plan: Return in about 6 months (around 11/21/2017) for twenty minute follow-up with fasting labs.  An after-visit summary was printed and given to the patient at check-out.  Please see the patient instructions which may contain other information and recommendations beyond what is mentioned above in the assessment and plan.  Meds ordered this encounter  Medications  . ADVAIR DISKUS 250-50 MCG/DOSE AEPB    Sig: Inhale 1 puff into the lungs 2 (two) times daily.    Dispense:  180 each    Refill:  3  . albuterol (PROVENTIL HFA;VENTOLIN HFA) 108 (90 Base) MCG/ACT inhaler    Sig: Inhale 2 puffs into the lungs every 4 (four) hours as needed for wheezing or shortness of breath.    Dispense:  1 Inhaler    Refill:  1  . atorvastatin (LIPITOR) 20 MG tablet    Sig: Take 1 tablet (20 mg total) by mouth at bedtime.    Dispense:  90 tablet    Refill:  3  . sertraline (ZOLOFT) 50 MG tablet    Sig: Take 1 tablet (50 mg total) by mouth daily.    Dispense:  90 tablet    Refill:  3    Orders Placed This Encounter  Procedures  . DG Bone Density  . Lipid panel  . Spirometry with Graph

## 2017-05-23 NOTE — Assessment & Plan Note (Signed)
Check DEXA scan; adequate calcium and vit D and walking; practice good fall precautions

## 2017-05-23 NOTE — Assessment & Plan Note (Signed)
Order fasting lipids; limit fatty meats; continue new statin

## 2017-05-23 NOTE — Assessment & Plan Note (Signed)
Managed by Dr. Finnegan 

## 2017-05-23 NOTE — Assessment & Plan Note (Signed)
Spirometry today looked beautiful; continue Advair; refills given

## 2017-05-23 NOTE — Patient Instructions (Addendum)
You can check out the patient assistance program for Xarelto We'll get labs today If you have not heard anything from my staff in a week about any orders/referrals/studies from today, please contact us here to follow-up (336) [phone] Please do call to schedule your bone density study; the number to schedule one at either [facility] or [facility] is [phone](336) [phone] or [phone](336) [phone]

## 2017-05-23 NOTE — Assessment & Plan Note (Signed)
Checked by Dr. Orlie DakinFinnegan, recent mammo in Nov was negative

## 2017-05-23 NOTE — Assessment & Plan Note (Signed)
Continue sertraline 

## 2017-05-23 NOTE — Assessment & Plan Note (Signed)
Not having any bleeding

## 2017-05-24 ENCOUNTER — Encounter: Payer: Self-pay | Admitting: Family Medicine

## 2017-05-24 ENCOUNTER — Telehealth: Payer: Self-pay

## 2017-05-24 NOTE — Telephone Encounter (Signed)
-----   Message from Kerman PasseyMelinda P Lada, MD sent at 05/24/2017  8:30 AM EST ----- Please let the patient know that her cholesterol panel is fabulous; continue what she's doing; thank you

## 2017-05-24 NOTE — Telephone Encounter (Signed)
Called pt informed her of lab results below per Dr.Lada. Pt gave verbal understanding.

## 2017-05-29 ENCOUNTER — Telehealth: Payer: Self-pay

## 2017-05-29 NOTE — Telephone Encounter (Signed)
Called pt informed her that there is a place for her to sign on her disability parking paperwork before it can be completed. Pt states that she will come by tomorrow 05/30/17 and sign. Will place paper work up front for pt.

## 2017-06-13 ENCOUNTER — Telehealth: Payer: Self-pay | Admitting: Family Medicine

## 2017-06-13 NOTE — Telephone Encounter (Signed)
Copied from CRM [phone]. Topic: Quick Communication - Rx Refill/Question >> Jun 13, 2017  9:20 AM Waymon AmatoBurton, Donna F wrote: Medication: advair, zoloft, atorvastatin    Has the patient contacted their pharmacy?yes but has not been sent to office yet  (Agent: If no, request that the patient contact the pharmacy for the refill.)   Preferred Pharmacy (with phone number or street name): [email or web address]  mail service    Agent: Please be advised that RX refills may take up to 3 business days. We ask that you follow-up with your pharmacy.

## 2017-06-13 NOTE — Telephone Encounter (Signed)
Copied from CRM #[id number]. Topic: Quick Communication - Rx Refill/Question °>> Jun 13, 2017  9:20 AM Burton, Donna F wrote: °Medication: advair, zoloft, atorvastatin  ° ° °Has the patient contacted their pharmacy?yes but has not been sent to office yet ° °(Agent: If no, request that the patient contact the pharmacy for the refill.)  ° °Preferred Pharmacy (with phone number or street name): [email or web address]  ° ° °Agent: Please be advised that RX refills may take up to 3 business days. We ask that you follow-up with your pharmacy. °

## 2017-06-13 NOTE — Telephone Encounter (Signed)
Pt notified already sent on 12/18

## 2017-06-14 NOTE — Telephone Encounter (Signed)
Already received rx requests via fax for these medications.

## 2017-07-19 ENCOUNTER — Encounter: Payer: Medicare PPO | Admitting: Family Medicine

## 2017-09-25 DIAGNOSIS — I480 Paroxysmal atrial fibrillation: Secondary | ICD-10-CM | POA: Diagnosis not present

## 2017-09-25 DIAGNOSIS — J4520 Mild intermittent asthma, uncomplicated: Secondary | ICD-10-CM | POA: Diagnosis not present

## 2017-09-25 DIAGNOSIS — E785 Hyperlipidemia, unspecified: Secondary | ICD-10-CM | POA: Diagnosis not present

## 2017-09-25 DIAGNOSIS — I491 Atrial premature depolarization: Secondary | ICD-10-CM | POA: Diagnosis not present

## 2017-10-04 ENCOUNTER — Telehealth: Payer: Self-pay | Admitting: Family Medicine

## 2017-10-04 NOTE — Telephone Encounter (Signed)
Copied from CRM [id number]. Topic: Quick Communication - See Telephone Encounter >> Oct 04, 2017  3:30 PM Terisa Starr wrote: CRM for notification. See Telephone encounter for: 10/04/17.  Patient said that she was sent three disc of ADVAIR DISKUS 250-50 MCG/DOSE AEPB. She needs Dr Sherie Don needs to send the name brand, she can not use generic. She will be sending the three back she received.  [facility] fax number is [phone]

## 2017-10-06 NOTE — Telephone Encounter (Signed)
I need a reason Please document why she cannot use the generic

## 2017-10-07 NOTE — Telephone Encounter (Signed)
Thank you As I look at the Advair prescription from May 23, 2017, it says "dispense as written" on the prescription I don't know why [facility] would send generic Please resolve with pharmacy

## 2017-10-07 NOTE — Telephone Encounter (Signed)
Pt states she has a-fib and it speeds up her heart.  But pt states pharmacy has already changed

## 2017-10-09 ENCOUNTER — Other Ambulatory Visit: Payer: Self-pay

## 2017-10-09 MED ORDER — ADVAIR DISKUS 250-50 MCG/DOSE IN AEPB: 1.0000 | INHALATION_SPRAY | Freq: Two times a day (BID) | RESPIRATORY_TRACT | 3 refills | Status: DC

## 2017-10-17 ENCOUNTER — Other Ambulatory Visit: Payer: Self-pay | Admitting: Family Medicine

## 2017-10-17 NOTE — Telephone Encounter (Signed)
Left voicemail with pharmacy 

## 2017-10-17 NOTE — Telephone Encounter (Signed)
[facility] has requested her sertraline I approved 90 + 3 on 05/23/17 Too soon for more refills Please resolve with pharmacy

## 2017-10-23 ENCOUNTER — Other Ambulatory Visit: Payer: Self-pay | Admitting: Family Medicine

## 2017-11-07 ENCOUNTER — Other Ambulatory Visit: Payer: Self-pay | Admitting: Family Medicine

## 2017-11-07 NOTE — Telephone Encounter (Signed)
appt later in June; will get labs; Rxs approved

## 2017-11-21 ENCOUNTER — Ambulatory Visit (INDEPENDENT_AMBULATORY_CARE_PROVIDER_SITE_OTHER): Payer: Medicare Other | Admitting: Family Medicine

## 2017-11-21 ENCOUNTER — Encounter: Payer: Self-pay | Admitting: Family Medicine

## 2017-11-21 VITALS — BP 110/80 | HR 87 | Temp 97.9°F | Resp 12 | Ht 64.5 in | Wt 134.8 lb

## 2017-11-21 DIAGNOSIS — H6992 Unspecified Eustachian tube disorder, left ear: Secondary | ICD-10-CM

## 2017-11-21 DIAGNOSIS — Z7901 Long term (current) use of anticoagulants: Secondary | ICD-10-CM | POA: Diagnosis not present

## 2017-11-21 DIAGNOSIS — E782 Mixed hyperlipidemia: Secondary | ICD-10-CM

## 2017-11-21 DIAGNOSIS — J45909 Unspecified asthma, uncomplicated: Secondary | ICD-10-CM | POA: Diagnosis not present

## 2017-11-21 DIAGNOSIS — J3089 Other allergic rhinitis: Secondary | ICD-10-CM

## 2017-11-21 DIAGNOSIS — M810 Age-related osteoporosis without current pathological fracture: Secondary | ICD-10-CM

## 2017-11-21 DIAGNOSIS — I480 Paroxysmal atrial fibrillation: Secondary | ICD-10-CM

## 2017-11-21 DIAGNOSIS — R195 Other fecal abnormalities: Secondary | ICD-10-CM | POA: Diagnosis not present

## 2017-11-21 DIAGNOSIS — R5383 Other fatigue: Secondary | ICD-10-CM

## 2017-11-21 DIAGNOSIS — H6982 Other specified disorders of Eustachian tube, left ear: Secondary | ICD-10-CM | POA: Diagnosis not present

## 2017-11-21 DIAGNOSIS — Z5181 Encounter for therapeutic drug level monitoring: Secondary | ICD-10-CM | POA: Diagnosis not present

## 2017-11-21 DIAGNOSIS — J309 Allergic rhinitis, unspecified: Secondary | ICD-10-CM | POA: Insufficient documentation

## 2017-11-21 NOTE — Patient Instructions (Addendum)
Try to use PLAIN allergy medicine without the decongestant Avoid: phenylephrine, phenylpropanolamine, and pseudoephredine Please do call to schedule your bone density study; the number to schedule one at either [facility] or [facility] is [phone] or [phone]) [phone]  Cholesterol Cholesterol is a fat. Your body needs a small amount of cholesterol. Cholesterol (plaque) may build up in your blood vessels (arteries). That makes you more likely to have a heart attack or stroke. You cannot feel your cholesterol level. Having a blood test is the only way to find out if your level is high. Keep your test results. Work with your doctor to keep your cholesterol at a good level. What do the results mean?  Total cholesterol is how much cholesterol is in your blood.  LDL is bad cholesterol. This is the type that can build up. Try to have low LDL.  HDL is good cholesterol. It cleans your blood vessels and carries LDL away. Try to have high HDL.  Triglycerides are fat that the body can store or burn for energy. What are good levels of cholesterol?  Total cholesterol below 200.  LDL below 100 is good for people who have health risks. LDL below 70 is good for people who have very high risks.  HDL above 40 is good. It is best to have HDL of 60 or higher.  Triglycerides below 150. How can I lower my cholesterol? Diet Follow your diet program as told by your doctor.  Choose fish, white meat chicken, or [place] that is roasted or baked. Try not to eat red meat, fried foods, sausage, or lunch meats.  Eat lots of fresh fruits and vegetables.  Choose whole grains, beans, pasta, potatoes, and cereals.  Choose olive oil, corn oil, or canola oil. Only use small amounts.  Try not to eat butter, mayonnaise, shortening, or palm kernel oils.  Try not to eat foods with trans fats.  Choose low-fat or nonfat dairy foods. ? Drink skim or nonfat milk. ? Eat low-fat or nonfat  yogurt and cheeses. ? Try not to drink whole milk or cream. ? Try not to eat ice cream, egg yolks, or full-fat cheeses.  Healthy desserts include angel food cake, ginger snaps, animal crackers, hard candy, popsicles, and low-fat or nonfat frozen yogurt. Try not to eat pastries, cakes, pies, and cookies.  Exercise Follow your exercise program as told by your doctor.  Be more active. Try gardening, walking, and taking the stairs.  Ask your doctor about ways that you can be more active.  Medicine  Take over-the-counter and prescription medicines only as told by your doctor. This information is not intended to replace advice given to you by your health care provider. Make sure you discuss any questions you have with your health care provider. Document Released: 08/17/2008 Document Revised: 12/21/2015 Document Reviewed: 12/01/2015 Elsevier Interactive Patient Education  [employer]2018 Elsevier Inc.

## 2017-11-21 NOTE — Assessment & Plan Note (Signed)
Continue same meds

## 2017-11-21 NOTE — Assessment & Plan Note (Signed)
Repeat stool cards; patient agrees; not seeing any blood

## 2017-11-21 NOTE — Progress Notes (Signed)
BP 110/80   Pulse 87   Temp 97.9 F (36.6 C) (Oral)   Resp 12   Ht 5' 4.5" (1.638 m)   Wt 134 lb 12.8 oz (61.1 kg)   SpO2 95%   BMI 22.78 kg/m    Subjective:    Patient ID: Alisha Hernandez, female    DOB: February 13, 1941, 77 y.o.   MRN: [id number]  HPI: Alisha Hernandez is a 77 y.o. female  Chief Complaint  Patient presents with  . Follow-up  . Ear Pain    left, has since stopped since zyrtec    HPI She is here for f/u  She had a bad allergy attack; runny nose, watery eyes, left ear pain; hurt so bad, lay on a heating pad; starting taking zyrtec and that helped; still not quite right;   Asthma; using Advair daily; using SABA but not more than 2x a week  Home health recommended shingles but patient is wary of the shot  She had blood in the stool years ago; she never did see the GI; she didn't think she needed to go  High cholesterol; patient eats some fatty meats; not as much cheese as she wants to; one egg a week  On anticoagulant lifelong for atrial fib; on xarelto, seeing Dr. Darrold Junker; no nosebleeds, no blood in urine or stool  Had mammogram and negative  Monoclonal B-cell lymphocytosis; seeing heme-onc, Dr. Orlie Dakin; seeing him yearly now; modestly low WBC in October  Osteoporosis; she has not had her DEXA yet; runs a little and gets exercise; getting calcium; fall precautions; tried fosamax; really getting weight bearing exercise  Hernandez Results  Component Value Date   CHOL 147 05/23/2017   HDL 74 05/23/2017   LDLCALC 59 05/23/2017   TRIG 61 05/23/2017   CHOLHDL 2.0 05/23/2017     Functional Status Survey: Is the patient deaf or have difficulty hearing?: No Does the patient have difficulty seeing, even when wearing glasses/contacts?: No Does the patient have difficulty concentrating, remembering, or making decisions?: No Does the patient have difficulty walking or climbing stairs?: No Does the patient have difficulty dressing or bathing?: No Does the patient  have difficulty doing errands alone such as visiting a doctor's office or shopping?: No  Fall Risk  11/21/2017 05/23/2017 01/29/2017 11/20/2016 02/23/2016  Falls in the past year? No No No No No  Number falls in past yr: - - - - -  Injury with Fall? - - - - -     Depression screen [facility] 2/9 11/21/2017 11/21/2017 05/23/2017 01/29/2017 11/20/2016  Decreased Interest 0 0 0 0 0  Down, Depressed, Hopeless 0 0 0 0 0  PHQ - 2 Score 0 0 0 0 0  Altered sleeping 1 - - - -  Tired, decreased energy 3 - - - -  Change in appetite 0 - - - -  Feeling bad or failure about yourself  0 - - - -  Trouble concentrating 1 - - - -  Moving slowly or fidgety/restless 0 - - - -  Suicidal thoughts 0 - - - -  PHQ-9 Score 5 - - - -  Difficult doing work/chores Not difficult at all - - - -    Relevant past medical, surgical, family and social history reviewed Past Medical History:  Diagnosis Date  . Anxiety   . Asthma   . Atrial fibrillation (HCC)   . C. difficile colitis   . Heart murmur   . Hypercholesteremia   .  Osteoporosis   . Pseudomembranous colitis    Past Surgical History:  Procedure Laterality Date  . ABDOMINAL HYSTERECTOMY    . NASAL SINUS SURGERY     Family History  Problem Relation Age of Onset  . Heart attack Mother   . Heart disease Mother   . Cancer Mother        breast  . Hypertension Mother   . Breast cancer Mother [id number]  . Cancer Father        skin and prostate  . Cancer Sister        lymphoma  . Diabetes Sister   . Stroke Sister   . Diabetes Brother   . COPD Neg Hx    Social History   Tobacco Use  . Smoking status: Never Smoker  . Smokeless tobacco: Never Used  Substance Use Topics  . Alcohol use: No  . Drug use: No    Interim medical history since last visit reviewed. Allergies and medications reviewed  Review of Systems Per HPI unless specifically indicated above     Objective:    BP 110/80   Pulse 87   Temp 97.9 F (36.6 C) (Oral)   Resp 12   Ht 5' 4.5"  (1.638 m)   Wt 134 lb 12.8 oz (61.1 kg)   SpO2 95%   BMI 22.78 kg/m   Wt Readings from Last 3 Encounters:  11/21/17 134 lb 12.8 oz (61.1 kg)  05/23/17 137 lb 8 oz (62.4 kg)  03/26/17 137 lb 8 oz (62.4 kg)    Physical Exam  Constitutional: She appears well-developed and well-nourished. No distress.  Appears years younger than stated age  HENT:  Head: Normocephalic and atraumatic.  Right Ear: Tympanic membrane and ear canal normal.  Left Ear: Tympanic membrane and ear canal normal.  Nose: No rhinorrhea.  Mouth/Throat: No posterior oropharyngeal edema or posterior oropharyngeal erythema.  Eyes: EOM are normal. No scleral icterus.  Neck: No thyromegaly present.  Cardiovascular: Normal rate and normal heart sounds. An irregularly irregular rhythm present.  No murmur heard. Pulmonary/Chest: Effort normal and breath sounds normal. No respiratory distress. She has no wheezes.  Abdominal: Soft. Bowel sounds are normal. She exhibits no distension.  Musculoskeletal: Normal range of motion. She exhibits no edema.  Neurological: She is alert. She exhibits normal muscle tone.  Skin: Skin is warm and dry. She is not diaphoretic. No pallor.  Psychiatric: She has a normal mood and affect. Her behavior is normal. Judgment and thought content normal.    Results for orders placed or performed in visit on 05/23/17  Lipid panel  Result Value Ref Range   Cholesterol 147 <200 mg/dL   HDL 74 >78>50 mg/dL   Triglycerides 61 <295<150 mg/dL   LDL Cholesterol (Calc) 59 mg/dL (calc)   Total CHOL/HDL Ratio 2.0 <5.0 (calc)   Non-HDL Cholesterol (Calc) 73 <621<130 mg/dL (calc)      Assessment & Plan:   Problem List Items Addressed This Visit      Cardiovascular and Mediastinum   A-fib (HCC)    Controlled rate; continue blood thinner        Respiratory   Asthma    Continue same meds      Allergic rhinitis    Perfumes, strong odors; continue plain zyrtec        Musculoskeletal and Integument    Osteoporosis    Getting exercise, practicing fall precautions; check vit D; patient will schedule DEXA      Relevant Orders  VITAMIN D 25 Hydroxy (Vit-D Deficiency, Fractures)     Other   Fatigue   Relevant Orders   Vitamin B12   TSH   Medication monitoring encounter   Relevant Orders   COMPLETE METABOLIC PANEL WITH GFR   Stool guaiac positive    Repeat stool cards; patient agrees; not seeing any blood      HLD (hyperlipidemia)    Continue medicine, try to limit saturated fats      Relevant Orders   Lipid panel   Anticoagulant long-term use    Check CBC      Relevant Orders   CBC with Differential/Platelet    Other Visit Diagnoses    Eustachian tube dysfunction, left    -  Primary   continue zyrtec; consider nasal corticosteroid; avoid triggers       Follow up plan: Return in about 6 months (around 05/23/2018) for follow-up visit with Dr. Sherie Don; Medicare Wellness visit with Ammie.  An after-visit summary was printed and given to the patient at check-out.  Please see the patient instructions which may contain other information and recommendations beyond what is mentioned above in the assessment and plan.  No orders of the defined types were placed in this encounter.   Orders Placed This Encounter  Procedures  . CBC with Differential/Platelet  . COMPLETE METABOLIC PANEL WITH GFR  . Lipid panel  . VITAMIN D 25 Hydroxy (Vit-D Deficiency, Fractures)  . Vitamin B12  . TSH

## 2017-11-21 NOTE — Assessment & Plan Note (Signed)
Check CBC 

## 2017-11-21 NOTE — Assessment & Plan Note (Signed)
Controlled rate; continue blood thinner

## 2017-11-21 NOTE — Assessment & Plan Note (Signed)
Continue medicine, try to limit saturated fats

## 2017-11-21 NOTE — Assessment & Plan Note (Signed)
Getting exercise, practicing fall precautions; check vit D; patient will schedule DEXA

## 2017-11-21 NOTE — Assessment & Plan Note (Addendum)
Perfumes, strong odors; continue plain zyrtec

## 2017-11-22 LAB — COMPLETE METABOLIC PANEL WITH GFR
AG RATIO: 1.5 (calc) (ref 1.0–2.5)
ALT: 18 U/L (ref 6–29)
AST: 21 U/L (ref 10–35)
Albumin: 4.8 g/dL (ref 3.6–5.1)
Alkaline phosphatase (APISO): 85 U/L (ref 33–130)
BUN: 18 mg/dL (ref 7–25)
CALCIUM: 10.2 mg/dL (ref 8.6–10.4)
CHLORIDE: 102 mmol/L (ref 98–110)
CO2: 30 mmol/L (ref 20–32)
Creat: 0.73 mg/dL (ref 0.60–0.93)
GFR, Est African American: 91 mL/min/{1.73_m2} (ref >=60)
GFR, Est Non African American: 79 mL/min/{1.73_m2} (ref >=60)
GLUCOSE: 79 mg/dL (ref 65–99)
Globulin: 3.2 g/dL (calc) (ref 1.9–3.7)
POTASSIUM: 4.4 mmol/L (ref 3.5–5.3)
Sodium: 140 mmol/L (ref 135–146)
Total Bilirubin: 0.8 mg/dL (ref 0.2–1.2)
Total Protein: 8 g/dL (ref 6.1–8.1)

## 2017-11-22 LAB — CBC WITH DIFFERENTIAL/PLATELET
Basophils Absolute: 11 cells/uL (ref 0–200)
Basophils Relative: 0.3 %
Eosinophils Absolute: 91 cells/uL (ref 15–500)
Eosinophils Relative: 2.4 %
HCT: 41 % (ref 35.0–45.0)
Hemoglobin: 14.5 g/dL (ref 11.7–15.5)
Lymphs Abs: 1539 cells/uL (ref 850–3900)
MCH: 32.7 pg (ref 27.0–33.0)
MCHC: 35.4 g/dL (ref 32.0–36.0)
MCV: 92.3 fL (ref 80.0–100.0)
MONOS PCT: 12.4 %
MPV: 11.8 fL (ref 7.5–12.5)
Neutro Abs: 1687 cells/uL (ref 1500–7800)
Neutrophils Relative %: 44.4 %
PLATELETS: 191 10*3/uL (ref 140–400)
RBC: 4.44 10*6/uL (ref 3.80–5.10)
RDW: 13 % (ref 11.0–15.0)
TOTAL LYMPHOCYTE: 40.5 %
WBC: 3.8 10*3/uL (ref 3.8–10.8)
WBCMIX: 471 {cells}/uL (ref 200–950)

## 2017-11-22 LAB — TSH: TSH: 1.26 m[IU]/L (ref 0.40–4.50)

## 2017-11-22 LAB — VITAMIN D 25 HYDROXY (VIT D DEFICIENCY, FRACTURES): VIT D 25 HYDROXY: 46 ng/mL (ref 30–100)

## 2017-11-22 LAB — LIPID PANEL
CHOLESTEROL: 160 mg/dL (ref <200)
HDL: 77 mg/dL (ref >50)
LDL Cholesterol (Calc): 69 mg/dL (calc)
Non-HDL Cholesterol (Calc): 83 mg/dL (calc) (ref <130)
TRIGLYCERIDES: 60 mg/dL (ref <150)
Total CHOL/HDL Ratio: 2.1 (calc) (ref <5.0)

## 2017-12-11 DIAGNOSIS — H35039 Hypertensive retinopathy, unspecified eye: Secondary | ICD-10-CM | POA: Diagnosis not present

## 2017-12-11 DIAGNOSIS — H2513 Age-related nuclear cataract, bilateral: Secondary | ICD-10-CM | POA: Diagnosis not present

## 2017-12-26 ENCOUNTER — Other Ambulatory Visit: Payer: Self-pay | Admitting: Obstetrics and Gynecology

## 2017-12-26 ENCOUNTER — Telehealth: Payer: Self-pay

## 2017-12-26 ENCOUNTER — Other Ambulatory Visit: Payer: Self-pay | Admitting: Family Medicine

## 2017-12-26 DIAGNOSIS — L900 Lichen sclerosus et atrophicus: Secondary | ICD-10-CM

## 2017-12-26 MED ORDER — TRIAMCINOLONE ACETONIDE 0.1 % EX OINT: 1.0000 "application " | TOPICAL_OINTMENT | CUTANEOUS | 0 refills | Status: DC | PRN

## 2017-12-26 NOTE — Telephone Encounter (Signed)
I did refill the medication once. She will need to be seen again for further refills to assess response. Her last visit was in 02/2017.

## 2017-12-26 NOTE — Telephone Encounter (Signed)
Pt states on triage line she is needing a refill of the Triamcinolone ointment. She does not use it very often just once in awhile. [facility] CB# [phone]

## 2017-12-26 NOTE — Telephone Encounter (Signed)
Last sgpt and lipids reviewed; Rx approved 

## 2018-01-15 ENCOUNTER — Encounter: Payer: Self-pay | Admitting: Podiatry

## 2018-01-15 ENCOUNTER — Ambulatory Visit (INDEPENDENT_AMBULATORY_CARE_PROVIDER_SITE_OTHER): Payer: Medicare Other

## 2018-01-15 ENCOUNTER — Ambulatory Visit: Payer: Medicare Other | Admitting: Podiatry

## 2018-01-15 VITALS — BP 114/73 | HR 63 | Resp 16

## 2018-01-15 DIAGNOSIS — M2041 Other hammer toe(s) (acquired), right foot: Secondary | ICD-10-CM

## 2018-01-15 NOTE — Progress Notes (Signed)
Subjective:  Patient ID: Alisha Hernandez, female    DOB: 1940-12-30,  MRN: [id number] HPI Chief Complaint  Patient presents with  . Toe Pain    2nd toe right - hammer toe deformity x years, noticing deformity worsening with overlapping the hallux, keeps toe padded which helps   . New Patient (Initial Visit)    77 y.o. female presents with the above complaint.   ROS: Denies fever chills nausea vomiting muscle aches pains calf pain back pain chest pain shortness of breath.  Past Medical History:  Diagnosis Date  . Anxiety   . Asthma   . Atrial fibrillation (HCC)   . C. difficile colitis   . Heart murmur   . Hypercholesteremia   . Osteoporosis   . Pseudomembranous colitis    Past Surgical History:  Procedure Laterality Date  . ABDOMINAL HYSTERECTOMY    . NASAL SINUS SURGERY      Current Outpatient Medications:  .  ADVAIR DISKUS 250-50 MCG/DOSE AEPB, Inhale 1 puff into the lungs 2 (two) times daily. *Brand name medically necessary, dispense as written for ADVAIR*, Disp: 180 each, Rfl: 3 .  albuterol (PROVENTIL HFA;VENTOLIN HFA) 108 (90 Base) MCG/ACT inhaler, Inhale 2 puffs into the lungs every 4 (four) hours as needed for wheezing or shortness of breath., Disp: 1 Inhaler, Rfl: 1 .  ALPRAZolam (XANAX) 0.25 MG tablet, Take 0.25 mg by mouth at bedtime as needed., Disp: , Rfl:  .  Ascorbic Acid (VITAMIN C) 100 MG tablet, Take 100 mg by mouth daily., Disp: , Rfl:  .  atorvastatin (LIPITOR) 20 MG tablet, TAKE 1 TABLET BY MOUTH AT  BEDTIME, Disp: 90 tablet, Rfl: 3 .  CARTIA XT 120 MG 24 hr capsule, Take 120 mg by mouth daily., Disp: , Rfl:  .  cetirizine (ZYRTEC) 10 MG tablet, Take 10 mg by mouth daily., Disp: , Rfl:  .  cholecalciferol (VITAMIN D) 1000 UNITS tablet, Take 1,000 Units by mouth daily., Disp: , Rfl:  .  diltiazem (CARDIZEM) 60 MG tablet, Take 60 mg by mouth as needed. If heart starts racing, Disp: , Rfl:  .  levocetirizine (XYZAL) 5 MG tablet, Take 0.5 tablets (2.5 mg  total) by mouth every evening. (Patient not taking: Reported on 11/21/2017), Disp: 30 tablet, Rfl: 0 .  Multiple Vitamins-Minerals (MULTIVITAMIN WOMEN 50+ PO), , Disp: , Rfl:  .  nitroGLYCERIN (NITROSTAT) 0.4 MG SL tablet, Place 1 tablet under the tongue every 5 (five) minutes as needed. For chest pain, Disp: , Rfl:  .  rivaroxaban (XARELTO) 20 MG TABS tablet, Take 20 mg by mouth daily with supper. Reported on 07/27/2015, Disp: , Rfl:  .  sertraline (ZOLOFT) 50 MG tablet, TAKE 1 TABLET BY MOUTH  DAILY, Disp: 90 tablet, Rfl: 3 .  triamcinolone ointment (KENALOG) 0.1 %, Apply 1 application topically as needed. Apply BID x 2 weeks, HS x 2 weeks, M-W-F x 2 weeks, Disp: 30 g, Rfl: 0  Allergies  Allergen Reactions  . Penicillin G Other (See Comments)    Pt. Can't remember the side effect, but has always not taken it.  . Cefdinir Nausea And Vomiting  . Levofloxacin Nausea And Vomiting    racing heart   Review of Systems Objective:   Vitals:   01/15/18 1321  BP: 114/73  Pulse: 63  Resp: 16    General: Well developed, nourished, in no acute distress, alert and oriented x3   Dermatological: Skin is warm, dry and supple bilateral. Nails x 10 are  well maintained; remaining integument appears unremarkable at this time. There are no open sores, no preulcerative lesions, no rash or signs of infection present.  Vascular: Dorsalis Pedis artery and Posterior Tibial artery pedal pulses are 2/4 bilateral with immedate capillary fill time. Pedal hair growth present. No varicosities and no lower extremity edema present bilateral.   Neruologic: Grossly intact via light touch bilateral. Vibratory intact via tuning fork bilateral. Protective threshold with Semmes Wienstein monofilament intact to all pedal sites bilateral. Patellar and Achilles deep tendon reflexes 2+ bilateral. No Babinski or clonus noted bilateral.   Musculoskeletal: No gross boney pedal deformities bilateral. No pain, crepitus, or limitation  noted with foot and ankle range of motion bilateral. Muscular strength 5/5 in all groups tested bilateral.  Hallux abductovalgus deformity of the right foot with overlapping hammertoe deformity second right.  Gait: Unassisted, Nonantalgic.    Radiographs:  Radiographs demonstrate an osseously mature individual with hammertoe deformity second right dislocation of the second toe dorsally onto the second metatarsal head.  Hallux valgus deformity right.  No acute findings.  Assessment & Plan:   Assessment: Hallux abductovalgus deformity hammertoe deformity second right dislocation of second metatarsal phalangeal joint right.  Plan: This the discussed the pros and cons of surgical intervention today she decided not to do this because of the time that she would be down for surgery.  I did dispense a digital brace to help hold her second and third toes in good position.     [name] T. [place], [place]

## 2018-02-28 ENCOUNTER — Ambulatory Visit (INDEPENDENT_AMBULATORY_CARE_PROVIDER_SITE_OTHER): Payer: Medicare Other

## 2018-02-28 VITALS — BP 112/60 | HR 62 | Temp 97.5°F | Resp 14 | Ht 65.0 in | Wt 138.7 lb

## 2018-02-28 DIAGNOSIS — Z1231 Encounter for screening mammogram for malignant neoplasm of breast: Secondary | ICD-10-CM

## 2018-02-28 DIAGNOSIS — Z0000 Encounter for general adult medical examination without abnormal findings: Secondary | ICD-10-CM

## 2018-02-28 DIAGNOSIS — Z1239 Encounter for other screening for malignant neoplasm of breast: Secondary | ICD-10-CM

## 2018-02-28 NOTE — Progress Notes (Signed)
Subjective:   Alisha Hernandez is a 77 y.o. female who presents for Medicare Annual (Subsequent) preventive examination.  Review of Systems:  N/A Cardiac Risk Factors include: advanced age (>18men, >66 women);dyslipidemia;hypertension     Objective:     Vitals: BP 112/60 (BP Location: Left Arm, Patient Position: Sitting, Cuff Size: Normal)   Pulse 62   Temp (!) 97.5 F (36.4 C) (Oral)   Resp 14   Ht 5\' 5"  (1.651 m)   Wt 138 lb 11.2 oz (62.9 kg)   SpO2 94%   BMI 23.08 kg/m   Body mass index is 23.08 kg/m.  Advanced Directives 02/28/2018 03/26/2017 02/21/2017 02/03/2017 01/29/2017 11/20/2016 03/12/2016  Does Patient Have a Medical Advance Directive? Yes Yes Yes Yes Yes Yes Yes  Type of Estate agent of [place];Living will Living will;Healthcare Power of [employer] Power of [place];Living will Healthcare Power of [place];Living will - - Living will  Does patient want to make changes to medical advance directive? - - - No - Patient declined - - -  Copy of Healthcare Power of Attorney in Chart? No - copy requested - No - copy requested No - copy requested - - No - copy requested  Would patient like information on creating a medical advance directive? - - - No - Patient declined - - -    Tobacco Social History   Tobacco Use  Smoking Status Never Smoker  Smokeless Tobacco Never Used  Tobacco Comment   smoking cessation materials not required     Counseling given: No Comment: smoking cessation materials not required  Clinical Intake:  Pre-visit preparation completed: Yes  Pain : No/denies pain   BMI - recorded: 23.08 Nutritional Status: BMI of 19-24  Normal Nutritional Risks: None Diabetes: No  How often do you need to have someone help you when you read instructions, pamphlets, or other written materials from your doctor or pharmacy?: 1 - Never  Interpreter Needed?: No  Information entered by :: AEversole, LPN  Past Medical History:    Diagnosis Date  . Anxiety   . Asthma   . Atrial fibrillation (HCC)   . C. difficile colitis   . Heart murmur   . Hypercholesteremia   . Osteoporosis   . Pseudomembranous colitis    Past Surgical History:  Procedure Laterality Date  . ABDOMINAL HYSTERECTOMY    . NASAL SINUS SURGERY     Family History  Problem Relation Age of Onset  . Heart attack Mother   . Heart disease Mother   . Cancer Mother        breast  . Hypertension Mother   . Breast cancer Mother 16  . Cancer Father        skin and prostate  . Cancer Sister        lymphoma  . Diabetes Sister   . Stroke Sister   . Dementia Sister   . Diabetes Brother   . COPD Neg Hx    Social History   Socioeconomic History  . Marital status: Married    Spouse name: Fayrene Fearing  . Number of children: 2  . Years of education: some college  . Highest education level: 12th grade  Occupational History  . Occupation: Retired  Engineer, production  . Financial resource strain: Not hard at all  . Food insecurity:    Worry: Never true    Inability: Never true  . Transportation needs:    Medical: No    Non-medical: No  Tobacco  Use  . Smoking status: Never Smoker  . Smokeless tobacco: Never Used  . Tobacco comment: smoking cessation materials not required  Substance and Sexual Activity  . Alcohol use: No  . Drug use: No  . Sexual activity: Not Currently    Birth control/protection: Post-menopausal  Lifestyle  . Physical activity:    Days per week: 7 days    Minutes per session: 20 min  . Stress: Not at all  Relationships  . Social connections:    Talks on phone: Patient refused    Gets together: Patient refused    Attends religious service: Patient refused    Active member of club or organization: Patient refused    Attends meetings of clubs or organizations: Patient refused    Relationship status: Married  Other Topics Concern  . Not on file  Social History Narrative  . Not on file    Outpatient Encounter Medications  as of 02/28/2018  Medication Sig  . ADVAIR DISKUS 250-50 MCG/DOSE AEPB Inhale 1 puff into the lungs 2 (two) times daily. *Brand name medically necessary, dispense as written for ADVAIR*  . albuterol (PROVENTIL HFA;VENTOLIN HFA) 108 (90 Base) MCG/ACT inhaler Inhale 2 puffs into the lungs every 4 (four) hours as needed for wheezing or shortness of breath.  . ALPRAZolam (XANAX) 0.25 MG tablet Take 0.25 mg by mouth at bedtime as needed.  Marland Kitchen atorvastatin (LIPITOR) 20 MG tablet TAKE 1 TABLET BY MOUTH AT  BEDTIME  . cholecalciferol (VITAMIN D) 1000 UNITS tablet Take 1,000 Units by mouth daily.  Marland Kitchen diltiazem (CARDIZEM) 60 MG tablet Take 60 mg by mouth as needed. If heart starts racing  . Multiple Vitamins-Minerals (MULTIVITAMIN WOMEN 50+ PO)   . rivaroxaban (XARELTO) 20 MG TABS tablet Take 20 mg by mouth daily with supper. Reported on 07/27/2015  . sertraline (ZOLOFT) 50 MG tablet TAKE 1 TABLET BY MOUTH  DAILY  . triamcinolone ointment (KENALOG) 0.1 % Apply 1 application topically as needed. Apply BID x 2 weeks, HS x 2 weeks, M-W-F x 2 weeks  . Ascorbic Acid (VITAMIN C) 100 MG tablet Take 100 mg by mouth daily.  Marland Kitchen CARTIA XT 120 MG 24 hr capsule Take 120 mg by mouth daily.  . cetirizine (ZYRTEC) 10 MG tablet Take 10 mg by mouth daily.  Marland Kitchen levocetirizine (XYZAL) 5 MG tablet Take 0.5 tablets (2.5 mg total) by mouth every evening. (Patient not taking: Reported on 02/28/2018)  . nitroGLYCERIN (NITROSTAT) 0.4 MG SL tablet Place 1 tablet under the tongue every 5 (five) minutes as needed. For chest pain   No facility-administered encounter medications on file as of 02/28/2018.     Activities of Daily Living In your present state of health, do you have any difficulty performing the following activities: 02/28/2018 11/21/2017  Hearing? N N  Comment denies hearing aids -  Vision? N N  Comment wears eyeglasses; macular degeneration -  Difficulty concentrating or making decisions? N N  Walking or climbing stairs? N N    Dressing or bathing? N N  Doing errands, shopping? N N  Preparing Food and eating ? N -  Comment denies dentures -  Using the Toilet? N -  In the past six months, have you accidently leaked urine? N -  Do you have problems with loss of bowel control? N -  Managing your Medications? N -  Managing your Finances? N -  Housekeeping or managing your Housekeeping? N -  Some recent data might be hidden  Patient Care Team: Kerman Passey, MD as PCP - General (Family Medicine)    Assessment:   This is a routine wellness examination for Flavia.  Exercise Activities and Dietary recommendations Current Exercise Habits: Structured exercise class, Type of exercise: strength training/weights;walking, Time (Minutes): 20, Frequency (Times/Week): 7, Weekly Exercise (Minutes/Week): 140, Intensity: Mild, Exercise limited by: None identified  Goals    . DIET - EAT MORE FRUITS AND VEGETABLES     Recommend to increase intake of fruits and vegetables and to decrease portion sizes by eating 3 small healthy meals and at least 2 healthy snacks per day.       Fall Risk Fall Risk  02/28/2018 11/21/2017 05/23/2017 01/29/2017 11/20/2016  Falls in the past year? No No No No No  Number falls in past yr: - - - - -  Injury with Fall? - - - - -  Risk for fall due to : Impaired vision - - - -  Risk for fall due to: Comment wears eyeglasses; macular degeneration - - - -   FALL RISK PREVENTION PERTAINING TO THE HOME:  Any stairs in or around the home WITH handrails? Yes  Home free of loose throw rugs in walkways, pet beds, electrical cords, etc? Yes  Adequate lighting in your home to reduce risk of falls? Yes   ASSISTIVE DEVICES UTILIZED TO PREVENT FALLS:  Life alert? No  Use of a cane, walker or w/c? No  Grab bars in the bathroom? No  Shower chair or bench in shower? No  Elevated toilet seat or a handicapped toilet? No   DME ORDERS:  DME order needed?  No   TIMED UP AND GO:  Was the test  performed? Yes .  Length of time to ambulate 10 feet: 5 sec.   GAIT:  Appearance of gait: Gait stead-fast and without the use of an assistive device.  Education: Fall risk prevention has been discussed.  Intervention(s) required? No   Depression Screen PHQ 2/9 Scores 02/28/2018 11/21/2017 11/21/2017 05/23/2017  PHQ - 2 Score 0 0 0 0  PHQ- 9 Score 0 5 - -     Cognitive Function     6CIT Screen 02/28/2018  What Year? 0 points  What month? 0 points  What time? 0 points  Count back from 20 2 points  Months in reverse 0 points  Repeat phrase 0 points  Total Score 2    Immunization History  Administered Date(s) Administered  . Influenza, Seasonal, Injecte, Preservative Fre 04/08/2007, 03/15/2008, 04/22/2012  . Influenza-Unspecified 03/07/2015, 03/31/2015, 04/27/2016, 03/28/2017  . Pneumococcal Conjugate-13 04/09/2014, 10/04/2014  . Pneumococcal Polysaccharide-23 11/06/2004, 04/22/2012  . Td 11/06/2004    Qualifies for Shingles Vaccine? Yes . Due for Shingrix. Education has been provided regarding the importance of this vaccine. Pt has been advised to call insurance company to determine out of pocket expense. Advised may also receive vaccine at local pharmacy or Health Dept. Verbalized acceptance and understanding.  Tdap: Although this vaccine is not a covered service during a Wellness Exam, does the patient still wish to receive this vaccine today?  No .  Education has been provided regarding the importance of this vaccine. Advised may receive this vaccine at local pharmacy or Health Dept. Aware to provide a copy of the vaccination record if obtained from local pharmacy or Health Dept. Verbalized acceptance and understanding.  Flu Vaccine: Due for Flu vaccine. Does the patient want to receive this vaccine today?  No . Education has been provided regarding  the importance of this vaccine but still declined. Advised may receive this vaccine at local pharmacy or Health Dept. Aware to  provide a copy of the vaccination record if obtained from local pharmacy or Health Dept. Verbalized acceptance and understanding.  Screening Tests Health Maintenance  Topic Date Due  . INFLUENZA VACCINE  04/03/2018 (Originally 01/02/2018)  . DEXA SCAN  05/23/2018 (Originally Mar 26, 1941)  . TETANUS/TDAP  03/01/2019 (Originally 11/07/2014)  . MAMMOGRAM  04/11/2018  . PNA vac Low Risk Adult  Completed   Cancer Screenings:  Colorectal Screening: No longer required.   Mammogram: Completed 04/11/17. Repeat every year.Ordered today. Pt provided with contact info and advised to call to schedule appt.   Bone Density: Ordered 05/23/17. Pt provided with contact info and advised to call to schedule appt.  Lung Cancer Screening: (Low Dose CT Chest recommended if Age 18-80 years, 30 pack-year currently smoking OR have quit w/in 15years.) does not qualify.   Additional Screening:  Hepatitis C Screening: does not qualify  Vision Screening: Recommended annual ophthalmology exams for early detection of glaucoma and other disorders of the eye. Is the patient up to date with their annual eye exam?  Yes  Who is the provider or what is the name of the office in which the pt attends annual eye exams? Dr. Clydene Pugh  Dental Screening: Recommended annual dental exams for proper oral hygiene  Community Resource Referral:  CRR required this visit?  No     Plan:  I have personally reviewed and addressed the Medicare Annual Wellness questionnaire and have noted the following in the patient's chart:  A. Medical and social history B. Use of alcohol, tobacco or illicit drugs  C. Current medications and supplements D. Functional ability and status E.  Nutritional status F.  Physical activity G. Advance directives H. List of other physicians I.  Hospitalizations, surgeries, and ER visits in previous 12 months J.  Vitals K. Screenings such as hearing and vision if needed, cognitive and depression L. Referrals  and appointments  In addition, I have reviewed and discussed with patient certain preventive protocols, quality metrics, and best practice recommendations. A written personalized care plan for preventive services as well as general preventive health recommendations were provided to patient.  See attached scanned questionnaire for additional information.   Signed,  Deon Pilling, LPN Nurse Health Advisor

## 2018-02-28 NOTE — Patient Instructions (Signed)
Alisha Hernandez , Thank you for taking time to come for your Medicare Wellness Visit. I appreciate your ongoing commitment to your health goals. Please review the following plan we discussed and let me know if I can assist you in the future.   Screening recommendations/referrals: Colorectal Screening: No longer required Mammogram: Please call to schedule your appointment Bone Density: Please call to schedule your appointment  Vision and Dental Exams: Recommended annual ophthalmology exams for early detection of glaucoma and other disorders of the eye Recommended annual dental exams for proper oral hygiene  Vaccinations: Influenza vaccine: Declined Pneumococcal vaccine: Up to date Tdap vaccine: Please call your insurance company to determine your out of pocket expense. You may also receive this vaccine at your local pharmacy or Health Dept. Shingles vaccine: Please call your insurance company to determine your out of pocket expense for the Shingrix vaccine. You may receive this vaccine at your local pharmacy.  Advanced directives: Please bring a copy of your POA (Power of Attorney) and/or Living Will to your next appointment.  Goals: Recommend to decrease portion sizes by eating 3 small healthy meals and at least 2 healthy snacks per day.  Next appointment: Please schedule your Annual Wellness Visit with your Nurse Health Advisor in one year.  Preventive Care 78 Years and Older, Female Preventive care refers to lifestyle choices and visits with your health care provider that can promote health and wellness. What does preventive care include?  A yearly physical exam. This is also called an annual well check.  Dental exams once or twice a year.  Routine eye exams. Ask your health care provider how often you should have your eyes checked.  Personal lifestyle choices, including:  Daily care of your teeth and gums.  Regular physical activity.  Eating a healthy diet.  Avoiding tobacco and  drug use.  Limiting alcohol use.  Practicing safe sex.  Taking low-dose aspirin every day if recommended by your health care provider.  Taking vitamin and mineral supplements as recommended by your health care provider. What happens during an annual well check? The services and screenings done by your health care provider during your annual well check will depend on your age, overall health, lifestyle risk factors, and family history of disease. Counseling  Your health care provider may ask you questions about your:  Alcohol use.  Tobacco use.  Drug use.  Emotional well-being.  Home and relationship well-being.  Sexual activity.  Eating habits.  History of falls.  Memory and ability to understand (cognition).  Work and work Astronomer.  Reproductive health. Screening  You may have the following tests or measurements:  Height, weight, and BMI.  Blood pressure.  Lipid and cholesterol levels. These may be checked every 5 years, or more frequently if you are over 63 years old.  Skin check.  Lung cancer screening. You may have this screening every year starting at age 71 if you have a 30-pack-year history of smoking and currently smoke or have quit within the past 15 years.  Fecal occult blood test (FOBT) of the stool. You may have this test every year starting at age 20.  Flexible sigmoidoscopy or colonoscopy. You may have a sigmoidoscopy every 5 years or a colonoscopy every 10 years starting at age 41.  Hepatitis C blood test.  Hepatitis B blood test.  Sexually transmitted disease (STD) testing.  Diabetes screening. This is done by checking your blood sugar (glucose) after you have not eaten for a while (fasting). You may  have this done every 1-3 years.  Bone density scan. This is done to screen for osteoporosis. You may have this done starting at age 34.  Mammogram. This may be done every 1-2 years. Talk to your health care provider about how often you  should have regular mammograms. Talk with your health care provider about your test results, treatment options, and if necessary, the need for more tests. Vaccines  Your health care provider may recommend certain vaccines, such as:  Influenza vaccine. This is recommended every year.  Tetanus, diphtheria, and acellular pertussis (Tdap, Td) vaccine. You may need a Td booster every 10 years.  Zoster vaccine. You may need this after age 68.  Pneumococcal 13-valent conjugate (PCV13) vaccine. One dose is recommended after age 36.  Pneumococcal polysaccharide (PPSV23) vaccine. One dose is recommended after age 61. Talk to your health care provider about which screenings and vaccines you need and how often you need them. This information is not intended to replace advice given to you by your health care provider. Make sure you discuss any questions you have with your health care provider. Document Released: 06/17/2015 Document Revised: 02/08/2016 Document Reviewed: 03/22/2015 Elsevier Interactive Patient Education  2017 [employer].  Fall Prevention in the Home Falls can cause injuries. They can happen to people of all ages. There are many things you can do to make your home safe and to help prevent falls. What can I do on the outside of my home?  Regularly fix the edges of walkways and driveways and fix any cracks.  Remove anything that might make you trip as you walk through a door, such as a raised step or threshold.  Trim any bushes or trees on the path to your home.  Use bright outdoor lighting.  Clear any walking paths of anything that might make someone trip, such as rocks or tools.  Regularly check to see if handrails are loose or broken. Make sure that both sides of any steps have handrails.  Any raised decks and porches should have guardrails on the edges.  Have any leaves, snow, or ice cleared regularly.  Use sand or salt on walking paths during winter.  Clean up any  spills in your garage right away. This includes oil or grease spills. What can I do in the bathroom?  Use night lights.  Install grab bars by the toilet and in the tub and shower. Do not use towel bars as grab bars.  Use non-skid mats or decals in the tub or shower.  If you need to sit down in the shower, use a plastic, non-slip stool.  Keep the floor dry. Clean up any water that spills on the floor as soon as it happens.  Remove soap buildup in the tub or shower regularly.  Attach bath mats securely with double-sided non-slip rug tape.  Do not have throw rugs and other things on the floor that can make you trip. What can I do in the bedroom?  Use night lights.  Make sure that you have a light by your bed that is easy to reach.  Do not use any sheets or blankets that are too big for your bed. They should not hang down onto the floor.  Have a firm chair that has side arms. You can use this for support while you get dressed.  Do not have throw rugs and other things on the floor that can make you trip. What can I do in the kitchen?  Clean up  any spills right away.  Avoid walking on wet floors.  Keep items that you use a lot in easy-to-reach places.  If you need to reach something above you, use a strong step stool that has a grab bar.  Keep electrical cords out of the way.  Do not use floor polish or wax that makes floors slippery. If you must use wax, use non-skid floor wax.  Do not have throw rugs and other things on the floor that can make you trip. What can I do with my stairs?  Do not leave any items on the stairs.  Make sure that there are handrails on both sides of the stairs and use them. Fix handrails that are broken or loose. Make sure that handrails are as long as the stairways.  Check any carpeting to make sure that it is firmly attached to the stairs. Fix any carpet that is loose or worn.  Avoid having throw rugs at the top or bottom of the stairs. If you  do have throw rugs, attach them to the floor with carpet tape.  Make sure that you have a light switch at the top of the stairs and the bottom of the stairs. If you do not have them, ask someone to add them for you. What else can I do to help prevent falls?  Wear shoes that:  Do not have high heels.  Have rubber bottoms.  Are comfortable and fit you well.  Are closed at the toe. Do not wear sandals.  If you use a stepladder:  Make sure that it is fully opened. Do not climb a closed stepladder.  Make sure that both sides of the stepladder are locked into place.  Ask someone to hold it for you, if possible.  Clearly mark and make sure that you can see:  Any grab bars or handrails.  First and last steps.  Where the edge of each step is.  Use tools that help you move around (mobility aids) if they are needed. These include:  Canes.  Walkers.  Scooters.  Crutches.  Turn on the lights when you go into a dark area. Replace any light bulbs as soon as they burn out.  Set up your furniture so you have a clear path. Avoid moving your furniture around.  If any of your floors are uneven, fix them.  If there are any pets around you, be aware of where they are.  Review your medicines with your doctor. Some medicines can make you feel dizzy. This can increase your chance of falling. Ask your doctor what other things that you can do to help prevent falls. This information is not intended to replace advice given to you by your health care provider. Make sure you discuss any questions you have with your health care provider. Document Released: 03/17/2009 Document Revised: 10/27/2015 Document Reviewed: 06/25/2014 Elsevier Interactive Patient Education  2017 [employer].

## 2018-03-05 ENCOUNTER — Encounter: Payer: Self-pay | Admitting: Nurse Practitioner

## 2018-03-05 ENCOUNTER — Ambulatory Visit (INDEPENDENT_AMBULATORY_CARE_PROVIDER_SITE_OTHER): Payer: Medicare Other | Admitting: Nurse Practitioner

## 2018-03-05 VITALS — BP 140/90 | HR 75 | Temp 98.3°F | Resp 16 | Ht 64.5 in | Wt 134.3 lb

## 2018-03-05 DIAGNOSIS — J0110 Acute frontal sinusitis, unspecified: Secondary | ICD-10-CM | POA: Diagnosis not present

## 2018-03-05 DIAGNOSIS — J45909 Unspecified asthma, uncomplicated: Secondary | ICD-10-CM | POA: Diagnosis not present

## 2018-03-05 DIAGNOSIS — R059 Cough, unspecified: Secondary | ICD-10-CM

## 2018-03-05 DIAGNOSIS — R05 Cough: Secondary | ICD-10-CM | POA: Diagnosis not present

## 2018-03-05 MED ORDER — AZITHROMYCIN 250 MG PO TABS: ORAL_TABLET | ORAL | 0 refills | Status: DC

## 2018-03-05 MED ORDER — ADVAIR DISKUS 250-50 MCG/DOSE IN AEPB: 1.0000 | INHALATION_SPRAY | Freq: Two times a day (BID) | RESPIRATORY_TRACT | 3 refills | Status: AC

## 2018-03-05 MED ORDER — ALBUTEROL SULFATE HFA 108 (90 BASE) MCG/ACT IN AERS: 2.0000 | INHALATION_SPRAY | Freq: Four times a day (QID) | RESPIRATORY_TRACT | 0 refills | Status: AC | PRN

## 2018-03-05 MED ORDER — GUAIFENESIN 200 MG PO TABS: 200.0000 mg | ORAL_TABLET | ORAL | 0 refills | Status: DC | PRN

## 2018-03-05 NOTE — Patient Instructions (Signed)
- Please take the antibiotic as prescribed for the entire course; even if you start to feel better before finishing. (azithromycin). - Please take the guaifenesin prescription or get musinex-DM over the counter- which ever one is cheaper; take this twice a day for at least 5 days and then just as needed for coughing nasal congestion. Can also use flonase - For sore throat- try honey, lemon teas and can also use lozenges - For sleeping, first try melatonin (you can get this over the counter)    _________ It it is important that you drink plenty of fluids, rest. Cover your nose/mouth when you cough or sneeze and wash your hands well and often. Here are some helpful things you can use or pick up over the counter from the pharmacy to help with your symptoms:   For Fever/Pain: Acetaminophen every 6 hours as needed (maximum of 3000mg  a day). If you are still uncomfortable you can add ibuprofen OR naproxen  For coughing: try dextromethorphan for a cough suppressant, and/or a cool mist humidifier, lozenges  For sore throat: saline gargles, honey herbal tea, lozenges, throat spray  To dry out your nose: try an antihistamine like loratadine (non-sedating) or diphenhydramine (sedating) or others To relieve a stuffy nose: flonase, neti pot To make blowing your nose easier: guaifenesin  _____ Sleep Hygiene Tips 1) Get regular. One of the best ways to train your body to sleep well is to go to bed and get up at more or less the same time every day, even on weekends and days off! This regular rhythm will make you feel better and will give your body something to work from. 2) Sleep when sleepy. Only try to sleep when you actually feel tired or sleepy, rather than spending too much time awake in bed. 3) Get up & try again. If you haven't been able to get to sleep after about 20 minutes or more, get up and do something calming or boring until you feel sleepy, then return to bed and try again. Sit quietly on  the couch with the lights off (bright light will tell your brain that it is time to wake up), or read something boring like the phone book. Avoid doing anything that is too stimulating or interesting, as this will wake you up even more. 4) Avoid caffeine & nicotine. It is best to avoid consuming any caffeine (in coffee, tea, cola drinks, chocolate, and some medications) or nicotine (cigarettes) for at least 4-6 hours before going to bed. These substances act as stimulants and interfere with the ability to fall asleep 5) Avoid alcohol. It is also best to avoid alcohol for at least 4-6 hours before going to bed. Many people believe that alcohol is relaxing and helps them to get to sleep at first, but it actually interrupts the quality of sleep. 6) Bed is for sleeping. Try not to use your bed for anything other than sleeping and sex, so that your body comes to associate bed with sleep. If you use bed as a place to watch TV, eat, read, work on your laptop, pay bills, and other things, your body will not learn this Connection. 7) No naps. It is best to avoid taking naps during the day, to make sure that you are tired at bedtime. If you can't make it through the day without a nap, make sure it is for less than an hour and before 3pm. 8) Sleep rituals. You can develop your own rituals of things to remind  your body that it is time to sleep - some people find it useful to do relaxing stretches or breathing exercises for 15 minutes before bed each night, or sit calmly with a cup of caffeine-free tea. 9) Bathtime. Having a hot bath 1-2 hours before bedtime can be useful, as it will raise your body temperature, causing you to feel sleepy as your body temperature drops again. Research shows that sleepiness is associated with a drop in body temperature. 10) No clock-watching. Many people who struggle with sleep tend to watch the clock too much. Frequently checking the clock during the night  can wake you up (especially if you turn on the light to read the time) and reinforces negative thoughts such as "Oh no, look how late it is, I'll never get to sleep" or "it's so early, I have only slept for 5 hours, this is terrible." 11) Use a sleep diary. This worksheet can be a useful way of making sure you have the right facts about your sleep, rather than making assumptions. Because a diary involves watching the clock (see point 10) it is a good idea to only use it for two weeks to get an idea of what is going and then perhaps two months down the track to see how you are progressing. 12) Exercise. Regular exercise is a good idea to help with good sleep, but try not to do strenuous exercise in the 4 hours before bedtime. Morning walks are a great way to start the day feeling refreshed! 13) Eat right. A healthy, balanced diet will help you to sleep well, but timing is important. Some people find that a very empty stomach at bedtime is distracting, so it can be useful to have a light snack, but a heavy meal soon before bed can also interrupt sleep. Some people recommend a warm glass of milk, which contains tryptophan, which acts as a natural sleep inducer. 14) The right space. It is very important that your bed and bedroom are quiet and comfortable for sleeping. A cooler room with enough blankets to stay warm is best, and make sure you have curtains or an eyemask to block out early morning light and earplugs if there is noise outside your room. 15) Keep daytime routine the same. Even if you have a bad night sleep and are tired it is important that you try to keep your daytime activities the same as you had planned. That is, don't avoid activities because you feel tired. This can reinforce the insomnia.

## 2018-03-05 NOTE — Progress Notes (Signed)
Name: Alisha Hernandez   MRN: [id number]    DOB: 28-Aug-1940   Date:03/05/2018       Progress Note  Subjective  Chief Complaint  Chief Complaint  Patient presents with  . Asthma  . Sore Throat  . Sinusitis    HPI States earlier last week had sore throat, then had bad coughing and wheezing, headache and facial fullness, started to improve some then got significantly worse on Monday and has been increasing since then worsening facial fullness and no energy. States ears feels like they will pop. Chills intermittently.  Denies dizziness, chest pain, shortness of breath, dysuria.  Asthma Takes advair 250-4mcg 1 puff BID daily using albuterol twice daily since acute illness, states expired SABA though.   Patient Active Problem List   Diagnosis Date Noted  . Allergic rhinitis 11/21/2017  . 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 Lichen sclerosus 12/24/2016  . Bursitis of hip 12/04/2016  . Monoclonal B-cell lymphocytosis of unknown significance 03/11/2016  . Visit for screening mammogram 02/23/2016  . Bright red rectal bleeding 02/23/2016  . Urinary frequency 02/23/2016  . Stool guaiac positive 04/19/2015  . Fatigue 04/18/2015  . Anticoagulant long-term use 04/18/2015  . Diarrhea 04/18/2015  . Dizziness 03/16/2015  . Medication monitoring encounter 03/16/2015  . Hyperreflexia of lower extremity 03/16/2015  . Pseudomembranous colitis 02/18/2015  . Osteoporosis 02/18/2015  . Asthma 02/18/2015  . HLD (hyperlipidemia) 02/25/2014  . APC (atrial premature contractions) 02/25/2014  . Anxiety 02/25/2014  . A-fib (HCC) 06/30/2012    Past Medical History:  Diagnosis Date  . Anxiety   . Asthma   . Atrial fibrillation (HCC)   . C. difficile colitis   . Heart murmur   . Hypercholesteremia   . Osteoporosis   . Pseudomembranous colitis     Past Surgical History:  Procedure Laterality Date  . ABDOMINAL HYSTERECTOMY    . NASAL SINUS SURGERY      Social History    Tobacco Use  . Smoking status: Never Smoker  . Smokeless tobacco: Never Used  . Tobacco comment: smoking cessation materials not required  Substance Use Topics  . Alcohol use: No     Current Outpatient Medications:  .  ADVAIR DISKUS 250-50 MCG/DOSE AEPB, Inhale 1 puff into the lungs 2 (two) times daily. *Brand name medically necessary, dispense as written for ADVAIR*, Disp: 180 each, Rfl: 3 .  albuterol (PROVENTIL HFA;VENTOLIN HFA) 108 (90 Base) MCG/ACT inhaler, Inhale 2 puffs into the lungs every 4 (four) hours as needed for wheezing or shortness of breath., Disp: 1 Inhaler, Rfl: 1 .  ALPRAZolam (XANAX) 0.25 MG tablet, Take 0.25 mg by mouth at bedtime as needed., Disp: , Rfl:  .  Ascorbic Acid (VITAMIN C) 100 MG tablet, Take 100 mg by mouth daily., Disp: , Rfl:  .  atorvastatin (LIPITOR) 20 MG tablet, TAKE 1 TABLET BY MOUTH AT  BEDTIME, Disp: 90 tablet, Rfl: 3 .  cetirizine (ZYRTEC) 10 MG tablet, Take 10 mg by mouth daily., Disp: , Rfl:  .  cholecalciferol (VITAMIN D) 1000 UNITS tablet, Take 1,000 Units by mouth daily., Disp: , Rfl:  .  diltiazem (CARDIZEM) 60 MG tablet, Take 60 mg by mouth as needed. If heart starts racing, Disp: , Rfl:  .  Multiple Vitamins-Minerals (MULTIVITAMIN WOMEN 50+ PO), , Disp: , Rfl:  .  rivaroxaban (XARELTO) 20 MG TABS tablet, Take 20 mg by mouth daily with supper. Reported on 07/27/2015, Disp: , Rfl:  .  sertraline (ZOLOFT)  50 MG tablet, TAKE 1 TABLET BY MOUTH  DAILY, Disp: 90 tablet, Rfl: 3 .  triamcinolone ointment (KENALOG) 0.1 %, Apply 1 application topically as needed. Apply BID x 2 weeks, HS x 2 weeks, M-W-F x 2 weeks, Disp: 30 g, Rfl: 0 .  CARTIA XT 120 MG 24 hr capsule, Take 120 mg by mouth daily., Disp: , Rfl:  .  levocetirizine (XYZAL) 5 MG tablet, Take 0.5 tablets (2.5 mg total) by mouth every evening. (Patient not taking: Reported on 03/05/2018), Disp: 30 tablet, Rfl: 0 .  nitroGLYCERIN (NITROSTAT) 0.4 MG SL tablet, Place 1 tablet under the tongue  every 5 (five) minutes as needed. For chest pain, Disp: , Rfl:   Allergies  Allergen Reactions  . Penicillin G Other (See Comments)    Pt. Can't remember the side effect, but has always not taken it.  . Cefdinir Nausea And Vomiting  . Levofloxacin Nausea And Vomiting    racing heart    ROS   No other specific complaints in a complete review of systems (except as listed in HPI above).  Objective  Vitals:   03/05/18 1050  BP: 140/90  Pulse: 75  Resp: 16  Temp: 98.3 F (36.8 C)  TempSrc: Oral  SpO2: 96%  Weight: 134 lb 4.8 oz (60.9 kg)  Height: 5' 4.5" (1.638 m)    Body mass index is 22.7 kg/m.  Nursing Note and Vital Signs reviewed.  Physical Exam  Constitutional: She appears well-developed and well-nourished. She is cooperative. She does not appear ill. No distress.  HENT:  Head: Normocephalic.  Right Ear: Hearing, tympanic membrane, external ear and ear canal normal.  Left Ear: Hearing, tympanic membrane, external ear and ear canal normal.  Nose: Mucosal edema present. Right sinus exhibits frontal sinus tenderness. Right sinus exhibits no maxillary sinus tenderness. Left sinus exhibits frontal sinus tenderness. Left sinus exhibits no maxillary sinus tenderness.  Mouth/Throat: Uvula is midline, oropharynx is clear and moist and mucous membranes are normal.  Cardiovascular: Normal rate, regular rhythm and normal heart sounds.  No lower extremity edema noted.  Pulmonary/Chest: Effort normal and breath sounds normal. She has no wheezes. She has no rhonchi. She exhibits no tenderness.  Abdominal: Normal appearance.  Neurological: She is alert.  Psychiatric: She has a normal mood and affect. Her speech is normal and behavior is normal. Thought content normal.  Vitals reviewed.    No results found for this or any previous visit (from the past 48 hour(s)).  Assessment & Plan  1. Moderate asthma without complication, unspecified whether persistent - albuterol (PROAIR  HFA) 108 (90 Base) MCG/ACT inhaler; Inhale 2 puffs into the lungs every 6 (six) hours as needed for wheezing or shortness of breath.  Dispense: 1 Inhaler; Refill: 0 - ADVAIR DISKUS 250-50 MCG/DOSE AEPB; Inhale 1 puff into the lungs 2 (two) times daily. *Brand name medically necessary, dispense as written for ADVAIR*  Dispense: 180 each; Refill: 3  2. Cough - guaiFENesin 200 MG tablet; Take 1 tablet (200 mg total) by mouth every 4 (four) hours as needed for cough or to loosen phlegm.  Dispense: 30 suppository; Refill: 0 - azithromycin (ZITHROMAX) 250 MG tablet; 2 tablets today and one tablet daily for the next 4 days.  Dispense: 6 tablet; Refill: 0  3. Acute non-recurrent frontal sinusitis - albuterol (PROAIR HFA) 108 (90 Base) MCG/ACT inhaler; Inhale 2 puffs into the lungs every 6 (six) hours as needed for wheezing or shortness of breath.  Dispense: 1 Inhaler; Refill:  0 - guaiFENesin 200 MG tablet; Take 1 tablet (200 mg total) by mouth every 4 (four) hours as needed for cough or to loosen phlegm.  Dispense: 30 suppository; Refill: 0 - azithromycin (ZITHROMAX) 250 MG tablet; 2 tablets today and one tablet daily for the next 4 days.  Dispense: 6 tablet; Refill: 0  Discussed ROC and ER precautions if unimproved or worsening.

## 2018-03-25 ENCOUNTER — Inpatient Hospital Stay: Payer: Medicare Other | Attending: Oncology

## 2018-03-25 DIAGNOSIS — D72820 Lymphocytosis (symptomatic): Secondary | ICD-10-CM

## 2018-03-25 LAB — CBC WITH DIFFERENTIAL/PLATELET
ABS IMMATURE GRANULOCYTES: 0.01 10*3/uL (ref 0.00–0.07)
BASOS ABS: 0 10*3/uL (ref 0.0–0.1)
Basophils Relative: 0 %
EOS PCT: 3 %
Eosinophils Absolute: 0.1 10*3/uL (ref 0.0–0.5)
HEMATOCRIT: 38 % (ref 36.0–46.0)
HEMOGLOBIN: 12.7 g/dL (ref 12.0–15.0)
IMMATURE GRANULOCYTES: 0 %
LYMPHS ABS: 1.5 10*3/uL (ref 0.7–4.0)
LYMPHS PCT: 40 %
MCH: 31.9 pg (ref 26.0–34.0)
MCHC: 33.4 g/dL (ref 30.0–36.0)
MCV: 95.5 fL (ref 80.0–100.0)
Monocytes Absolute: 0.5 10*3/uL (ref 0.1–1.0)
Monocytes Relative: 14 %
NEUTROS ABS: 1.6 10*3/uL — AB (ref 1.7–7.7)
NRBC: 0 % (ref 0.0–0.2)
Neutrophils Relative %: 43 %
Platelets: 212 10*3/uL (ref 150–400)
RBC: 3.98 MIL/uL (ref 3.87–5.11)
RDW: 12.8 % (ref 11.5–15.5)
WBC: 3.8 10*3/uL — ABNORMAL LOW (ref 4.0–10.5)

## 2018-03-26 ENCOUNTER — Other Ambulatory Visit: Payer: Medicare PPO

## 2018-03-28 ENCOUNTER — Encounter: Payer: Self-pay | Admitting: Nurse Practitioner

## 2018-03-28 ENCOUNTER — Ambulatory Visit (INDEPENDENT_AMBULATORY_CARE_PROVIDER_SITE_OTHER): Payer: Medicare Other | Admitting: Nurse Practitioner

## 2018-03-28 VITALS — BP 100/62 | HR 72 | Temp 98.0°F | Resp 16 | Ht 64.5 in | Wt 132.8 lb

## 2018-03-28 DIAGNOSIS — R059 Cough, unspecified: Secondary | ICD-10-CM

## 2018-03-28 DIAGNOSIS — R05 Cough: Secondary | ICD-10-CM

## 2018-03-28 DIAGNOSIS — J45909 Unspecified asthma, uncomplicated: Secondary | ICD-10-CM

## 2018-03-28 DIAGNOSIS — R0602 Shortness of breath: Secondary | ICD-10-CM | POA: Diagnosis not present

## 2018-03-28 LAB — COMP PANEL: LEUKEMIA/LYMPHOMA
CLINICAL INFO: 22
Immunophenotypic Profile: 1

## 2018-03-28 MED ORDER — PROMETHAZINE-DM 6.25-15 MG/5ML PO SYRP: 1.2500 mL | ORAL_SOLUTION | Freq: Every day | ORAL | 0 refills | Status: DC

## 2018-03-28 MED ORDER — ALBUTEROL SULFATE (2.5 MG/3ML) 0.083% IN NEBU
2.5000 mg | INHALATION_SOLUTION | Freq: Once | RESPIRATORY_TRACT | Status: AC
  Administered 2018-03-28: 2.5 mg via RESPIRATORY_TRACT

## 2018-03-28 MED ORDER — PREDNISONE 10 MG (48) PO TBPK: ORAL_TABLET | ORAL | 0 refills | Status: DC

## 2018-03-28 MED ORDER — BENZONATATE 100 MG PO CAPS: 200.0000 mg | ORAL_CAPSULE | Freq: Two times a day (BID) | ORAL | 0 refills | Status: DC | PRN

## 2018-03-28 NOTE — Progress Notes (Signed)
Name: Alisha Hernandez   MRN: [id number]    DOB: 04-11-1941   Date:03/28/2018       Progress Note  Subjective  Chief Complaint  Chief Complaint  Patient presents with  . Cough    Constant cough that is mostly dry but she does have clear mucous come up sometimes.  . Asthma    Thinks she is having an asthma flare.    HPI  Feels like she is starting to have an asthma exacerbation; notes cough that is largely dry but has had some clear drainage recently. Notes shortness of breath and chest tightness with exertion; takes advair twice a day. Has needed albuterol inhaler 2-3 puffs a day; concerns due to a fib. Denies fever, chills, sore throat, sinus pain.  She feels she may need some antibiotics and prednisone.   Patient Active Problem List   Diagnosis Date Noted  . Allergic rhinitis 11/21/2017  . 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 Lichen sclerosus 12/24/2016  . Bursitis of hip 12/04/2016  . Monoclonal B-cell lymphocytosis of unknown significance 03/11/2016  . Visit for screening mammogram 02/23/2016  . Bright red rectal bleeding 02/23/2016  . Urinary frequency 02/23/2016  . Stool guaiac positive 04/19/2015  . Fatigue 04/18/2015  . Anticoagulant long-term use 04/18/2015  . Diarrhea 04/18/2015  . Dizziness 03/16/2015  . Medication monitoring encounter 03/16/2015  . Hyperreflexia of lower extremity 03/16/2015  . Pseudomembranous colitis 02/18/2015  . Osteoporosis 02/18/2015  . Asthma 02/18/2015  . HLD (hyperlipidemia) 02/25/2014  . APC (atrial premature contractions) 02/25/2014  . Anxiety 02/25/2014  . A-fib (HCC) 06/30/2012    Past Medical History:  Diagnosis Date  . Anxiety   . Asthma   . Atrial fibrillation (HCC)   . C. difficile colitis   . Heart murmur   . Hypercholesteremia   . Osteoporosis   . Pseudomembranous colitis     Past Surgical History:  Procedure Laterality Date  . ABDOMINAL HYSTERECTOMY    . NASAL SINUS SURGERY      Social  History   Tobacco Use  . Smoking status: Never Smoker  . Smokeless tobacco: Never Used  . Tobacco comment: smoking cessation materials not required  Substance Use Topics  . Alcohol use: No     Current Outpatient Medications:  .  ADVAIR DISKUS 250-50 MCG/DOSE AEPB, Inhale 1 puff into the lungs 2 (two) times daily. *Brand name medically necessary, dispense as written for ADVAIR*, Disp: 180 each, Rfl: 3 .  albuterol (PROAIR HFA) 108 (90 Base) MCG/ACT inhaler, Inhale 2 puffs into the lungs every 6 (six) hours as needed for wheezing or shortness of breath., Disp: 1 Inhaler, Rfl: 0 .  ALPRAZolam (XANAX) 0.25 MG tablet, Take 0.25 mg by mouth at bedtime as needed., Disp: , Rfl:  .  Ascorbic Acid (VITAMIN C) 100 MG tablet, Take 100 mg by mouth daily., Disp: , Rfl:  .  atorvastatin (LIPITOR) 20 MG tablet, TAKE 1 TABLET BY MOUTH AT  BEDTIME, Disp: 90 tablet, Rfl: 3 .  CARTIA XT 120 MG 24 hr capsule, Take 120 mg by mouth daily., Disp: , Rfl:  .  cholecalciferol (VITAMIN D) 1000 UNITS tablet, Take 1,000 Units by mouth daily., Disp: , Rfl:  .  diltiazem (CARDIZEM) 60 MG tablet, Take 60 mg by mouth as needed. If heart starts racing, Disp: , Rfl:  .  guaiFENesin 200 MG tablet, Take 1 tablet (200 mg total) by mouth every 4 (four) hours as needed for cough or to  loosen phlegm., Disp: 30 suppository, Rfl: 0 .  Multiple Vitamins-Minerals (MULTIVITAMIN WOMEN 50+ PO), , Disp: , Rfl:  .  rivaroxaban (XARELTO) 20 MG TABS tablet, Take 20 mg by mouth daily with supper. Reported on 07/27/2015, Disp: , Rfl:  .  sertraline (ZOLOFT) 50 MG tablet, TAKE 1 TABLET BY MOUTH  DAILY, Disp: 90 tablet, Rfl: 3 .  triamcinolone ointment (KENALOG) 0.1 %, Apply 1 application topically as needed. Apply BID x 2 weeks, HS x 2 weeks, M-W-F x 2 weeks, Disp: 30 g, Rfl: 0 .  azithromycin (ZITHROMAX) 250 MG tablet, 2 tablets today and one tablet daily for the next 4 days. (Patient not taking: Reported on 03/28/2018), Disp: 6 tablet, Rfl: 0 .   cetirizine (ZYRTEC) 10 MG tablet, Take 10 mg by mouth daily., Disp: , Rfl:  .  levocetirizine (XYZAL) 5 MG tablet, Take 0.5 tablets (2.5 mg total) by mouth every evening. (Patient not taking: Reported on 03/28/2018), Disp: 30 tablet, Rfl: 0 .  nitroGLYCERIN (NITROSTAT) 0.4 MG SL tablet, Place 1 tablet under the tongue every 5 (five) minutes as needed. For chest pain, Disp: , Rfl:   Allergies  Allergen Reactions  . Penicillin G Other (See Comments)    Pt. Can't remember the side effect, but has always not taken it.  . Cefdinir Nausea And Vomiting  . Levofloxacin Nausea And Vomiting    racing heart    Review of Systems  Constitutional: Negative for chills and fever.  HENT: Negative for sinus pain.   Respiratory: Positive for cough, shortness of breath and wheezing.   Cardiovascular: Negative for palpitations.  Gastrointestinal: Negative for abdominal pain.  Skin: Negative for rash.  Neurological: Negative for dizziness.     No other specific complaints in a complete review of systems (except as listed in HPI above).  Objective  Vitals:   03/28/18 1329  BP: 100/62  Pulse: 72  Resp: 16  Temp: 98 F (36.7 C)  TempSrc: Oral  SpO2: 96%  Weight: 132 lb 12.8 oz (60.2 kg)  Height: 5' 4.5" (1.638 m)     Body mass index is 22.44 kg/m.  Nursing Note and Vital Signs reviewed.  Physical Exam  Constitutional: She is oriented to person, place, and time. She appears well-developed and well-nourished. She is cooperative.  HENT:  Head: Normocephalic and atraumatic.  Right Ear: Hearing normal.  Left Ear: Hearing normal.  Nose: Nose normal.  Mouth/Throat: Mucous membranes are normal. No oropharyngeal exudate.  Eyes: Pupils are equal, round, and reactive to light. Conjunctivae and EOM are normal.  Cardiovascular: Normal rate and intact distal pulses.  Pulmonary/Chest: Effort normal and breath sounds normal. She has no wheezes.  Abdominal: Normal appearance.  Musculoskeletal:  Normal range of motion.  Neurological: She is alert and oriented to person, place, and time.  Psychiatric: She has a normal mood and affect. Her speech is normal and behavior is normal. Judgment and thought content normal.       No results found for this or any previous visit (from the past 48 hour(s)).  Assessment & Plan  1. Shortness of breath Patient declines chest x-ray, lungs do sound clear discussed ER precautions in detail, neb treatment given as patient endorses shortness of breath; improved after treatment. Patient able to ambulate from front desk without increased work of breathing.  - albuterol (PROVENTIL) (2.5 MG/3ML) 0.083% nebulizer solution 2.5 mg - predniSONE (STERAPRED UNI-PAK 48 TAB) 10 MG (48) TBPK tablet; Take as directed  Dispense: 48 tablet; Refill: 0  2. Cough Musinex, patient declined tessalon perls, requested something that would assist with sleep. Geriatric considerations- discussed starting at quarter dose and increased to half dose at bedtime if needed.  - albuterol (PROVENTIL) (2.5 MG/3ML) 0.083% nebulizer solution 2.5 mg - promethazine-dextromethorphan (PROMETHAZINE-DM) 6.25-15 MG/5ML syrup; Take 1.3 mLs by mouth at bedtime.  Dispense: 118 mL; Refill: 0  3. Moderate asthma without complication, unspecified whether persistent Follow up with pulmonology on Tuesday  - albuterol (PROVENTIL) (2.5 MG/3ML) 0.083% nebulizer solution 2.5 mg - predniSONE (STERAPRED UNI-PAK 48 TAB) 10 MG (48) TBPK tablet; Take as directed  Dispense: 48 tablet; Refill: 0 - promethazine-dextromethorphan (PROMETHAZINE-DM) 6.25-15 MG/5ML syrup; Take 1.3 mLs by mouth at bedtime.  Dispense: 118 mL; Refill: 0

## 2018-03-28 NOTE — Patient Instructions (Addendum)
-   Continue to take your inhalers as prescribed - Take medicine to assist with coughing; start with 1.25 mL at night; If not drowsy can increase to 2.71mL at night  - take prednisone taper if unimproved in 2 days.  - you can continue to take musinex 600mg  twice a day - Rest and drink plenty of water  - Exercising your lungs may also help. You can do this by taking long slow deep breaths or blowing through a straw into a glass of water. Deep breathing is also good for clearing the mucus from your lungs: breathe deeply five to ten times and then cough or huff strongly a couple of times to move the mucus. ___________ - if worsening shortness of breath, or chest pains please seek immediate medical attention - Please keep follow up with pulmonologist

## 2018-04-01 DIAGNOSIS — J4531 Mild persistent asthma with (acute) exacerbation: Secondary | ICD-10-CM | POA: Diagnosis not present

## 2018-04-01 DIAGNOSIS — J4540 Moderate persistent asthma, uncomplicated: Secondary | ICD-10-CM | POA: Diagnosis not present

## 2018-04-01 DIAGNOSIS — J45901 Unspecified asthma with (acute) exacerbation: Secondary | ICD-10-CM | POA: Diagnosis not present

## 2018-04-01 DIAGNOSIS — J189 Pneumonia, unspecified organism: Secondary | ICD-10-CM | POA: Diagnosis not present

## 2018-04-01 DIAGNOSIS — J209 Acute bronchitis, unspecified: Secondary | ICD-10-CM | POA: Diagnosis not present

## 2018-04-06 NOTE — Progress Notes (Signed)
[place]  [phone] [phone]  ID: Era Bumpers OB: 23-Aug-1940  MR#: [id number]  XBL#:[id number]  Patient Care Team: Arnetha Courser, MD as PCP - General (Family Medicine)  CHIEF COMPLAINT: Monoclonal B-cell lymphocytosis of unknown significance.  INTERVAL HISTORY: Patient returns to clinic today for routine yearly evaluation and discussion of her laboratory work.  She has increasing problems with shortness of breath, chronic cough, and asthma over the past several weeks, but otherwise is felt well.  She has no neurologic complaints.  She denies any fevers.  She has a good appetite and denies weight loss.  She denies any chest pain or hemoptysis. She denies any nausea, vomiting, constipation, or diarrhea. She has no urinary complaints.  Patient offers no further specific complaints today.  REVIEW OF SYSTEMS:   Review of Systems  Constitutional: Negative.  Negative for fever, malaise/fatigue and weight loss.  Respiratory: Positive for cough and shortness of breath. Negative for hemoptysis.   Cardiovascular: Negative.  Negative for chest pain and leg swelling.  Gastrointestinal: Negative.  Negative for abdominal pain.  Genitourinary: Negative.  Negative for dysuria.  Musculoskeletal: Negative.  Negative for back pain.  Skin: Negative.  Negative for rash.  Neurological: Negative.  Negative for focal weakness, weakness and headaches.  Endo/Heme/Allergies: Does not bruise/bleed easily.  Psychiatric/Behavioral: Negative.  The patient is not nervous/anxious.     As per HPI. Otherwise, a complete review of systems is negative.  PAST MEDICAL HISTORY: Past Medical History:  Diagnosis Date  . Anxiety   . Asthma   . Atrial fibrillation ([place])   . C. difficile colitis   . Heart murmur   . Hypercholesteremia   . Osteoporosis   . Pseudomembranous colitis     PAST SURGICAL HISTORY: Past Surgical History:  Procedure Laterality Date  . ABDOMINAL  HYSTERECTOMY    . NASAL SINUS SURGERY      FAMILY HISTORY Family History  Problem Relation Age of Onset  . Heart attack Mother   . Heart disease Mother   . Cancer Mother        breast  . Hypertension Mother   . Breast cancer Mother 13  . Cancer Father        skin and prostate  . Cancer Sister        lymphoma  . Diabetes Sister   . Stroke Sister   . Dementia Sister   . Diabetes Brother   . COPD Neg Hx        ADVANCED DIRECTIVES:    HEALTH MAINTENANCE: Social History   Tobacco Use  . Smoking status: Never Smoker  . Smokeless tobacco: Never Used  . Tobacco comment: smoking cessation materials not required  Substance Use Topics  . Alcohol use: No  . Drug use: No     Colonoscopy:  PAP:  Bone density:  Lipid panel:  Allergies  Allergen Reactions  . Penicillin G Other (See Comments)    Pt. Can't remember the side effect, but has always not taken it.  . Cefdinir Nausea And Vomiting  . Levofloxacin Nausea And Vomiting    racing heart    Current Outpatient Medications  Medication Sig Dispense Refill  . ADVAIR DISKUS 250-50 MCG/DOSE AEPB Inhale 1 puff into the lungs 2 (two) times daily. *Brand name medically necessary, dispense as written for ADVAIR* 180 each 3  . ALPRAZolam (XANAX) 0.25 MG tablet Take 0.25 mg by mouth at bedtime as needed.    . Ascorbic Acid (VITAMIN C)  100 MG tablet Take 100 mg by mouth daily.    Marland Kitchen atorvastatin (LIPITOR) 20 MG tablet TAKE 1 TABLET BY MOUTH AT  BEDTIME 90 tablet 3  . CARTIA XT 120 MG 24 hr capsule Take 120 mg by mouth daily.    . cetirizine (ZYRTEC) 10 MG tablet Take 10 mg by mouth daily.    . cholecalciferol (VITAMIN D) 1000 UNITS tablet Take 1,000 Units by mouth daily.    Marland Kitchen diltiazem (CARDIZEM) 60 MG tablet Take 60 mg by mouth as needed. If heart starts racing    . Multiple Vitamins-Minerals (MULTIVITAMIN WOMEN 50+ PO)     . predniSONE (STERAPRED UNI-PAK 48 TAB) 10 MG (48) TBPK tablet Take as directed (Patient taking  differently: Take as directed) 48 tablet 0  . promethazine-dextromethorphan (PROMETHAZINE-DM) 6.25-15 MG/5ML syrup Take 1.3 mLs by mouth at bedtime. 118 mL 0  . rivaroxaban (XARELTO) 20 MG TABS tablet Take 20 mg by mouth daily with supper. Reported on 07/27/2015    . sertraline (ZOLOFT) 50 MG tablet TAKE 1 TABLET BY MOUTH  DAILY 90 tablet 3  . albuterol (PROAIR HFA) 108 (90 Base) MCG/ACT inhaler Inhale 2 puffs into the lungs every 6 (six) hours as needed for wheezing or shortness of breath. (Patient not taking: Reported on 04/08/2018) 1 Inhaler 0  . guaiFENesin 200 MG tablet Take 1 tablet (200 mg total) by mouth every 4 (four) hours as needed for cough or to loosen phlegm. (Patient not taking: Reported on 04/08/2018) 30 suppository 0  . nitroGLYCERIN (NITROSTAT) 0.4 MG SL tablet Place 1 tablet under the tongue every 5 (five) minutes as needed. For chest pain    . triamcinolone ointment (KENALOG) 0.1 % Apply 1 application topically as needed. Apply BID x 2 weeks, HS x 2 weeks, M-W-F x 2 weeks (Patient not taking: Reported on 04/08/2018) 30 g 0   No current facility-administered medications for this visit.     OBJECTIVE: Vitals:   04/08/18 1431  BP: 128/73  Pulse: 67  Resp: 18  Temp: (!) 95.7 F (35.4 C)     Body mass index is 23.05 kg/m.    ECOG FS:0 - Asymptomatic  General: Well-developed, well-nourished, no acute distress. Eyes: Pink conjunctiva, anicteric sclera. HEENT: Normocephalic, moist mucous membranes, clear oropharnyx. Lungs: Clear to auscultation bilaterally. Heart: Regular rate and rhythm. No rubs, murmurs, or gallops. Abdomen: Soft, nontender, nondistended. No organomegaly noted, normoactive bowel sounds. Musculoskeletal: No edema, cyanosis, or clubbing. Neuro: Alert, answering all questions appropriately. Cranial nerves grossly intact. Skin: No rashes or petechiae noted. Psych: Normal affect.   LAB RESULTS:  Lab Results  Component Value Date   NA 140 11/21/2017   K  4.4 11/21/2017   CL 102 11/21/2017   CO2 30 11/21/2017   GLUCOSE 79 11/21/2017   BUN 18 11/21/2017   CREATININE 0.73 11/21/2017   CALCIUM 10.2 11/21/2017   PROT 8.0 11/21/2017   ALBUMIN 4.3 02/03/2017   AST 21 11/21/2017   ALT 18 11/21/2017   ALKPHOS 59 02/03/2017   BILITOT 0.8 11/21/2017   GFRNONAA 79 11/21/2017   GFRAA 91 11/21/2017    Lab Results  Component Value Date   WBC 3.8 (L) 03/25/2018   NEUTROABS 1.6 (L) 03/25/2018   HGB 12.7 03/25/2018   HCT 38.0 03/25/2018   MCV 95.5 03/25/2018   PLT 212 03/25/2018     STUDIES: No results found.  ASSESSMENT: Monoclonal B-cell lymphocytosis of unknown significance.  PLAN:    1. Monoclonal B-cell lymphocytosis of  unknown significance: Peripheral blood flow cytometry reported continued small clonal B-cell population of approximately 1% of her cells.  Past several years this report has been approximately 3%.  This population was 2% 3 years ago and 1% of the population in 2015. This population is CD5 negative, CD10 negative with a kappa light chain restriction. No other significant immunophenotypic abnormalities were detected. This is of unclear clinical significance. No intervention is needed at this time.  Patient does not require bone marrow biopsy.  Return to clinic in 1 year with repeat laboratory work and further evaluation. 2.  Shortness of breath/cough: Continue monitoring and treatment per pulmonary.    Patient expressed understanding and was in agreement with this plan. She also understands that She can call clinic at any time with any questions, concerns, or complaints.    Lloyd Huger, MD   04/09/2018 6:56 AM

## 2018-04-08 ENCOUNTER — Inpatient Hospital Stay: Payer: Medicare Other | Attending: Oncology | Admitting: Oncology

## 2018-04-08 ENCOUNTER — Other Ambulatory Visit: Payer: Self-pay

## 2018-04-08 VITALS — BP 128/73 | HR 67 | Temp 95.7°F | Resp 18 | Wt 136.4 lb

## 2018-04-08 DIAGNOSIS — D72820 Lymphocytosis (symptomatic): Secondary | ICD-10-CM | POA: Insufficient documentation

## 2018-04-08 DIAGNOSIS — J4540 Moderate persistent asthma, uncomplicated: Secondary | ICD-10-CM | POA: Diagnosis not present

## 2018-04-08 DIAGNOSIS — R05 Cough: Secondary | ICD-10-CM | POA: Insufficient documentation

## 2018-04-08 DIAGNOSIS — R0602 Shortness of breath: Secondary | ICD-10-CM | POA: Diagnosis not present

## 2018-04-08 NOTE — Progress Notes (Signed)
Here for follow up. Pt stated she has been sick w " asthma" on 2 diff abx- stopped augmentin due to diarrhea, loose procutive cough- loose clear phlegm she stated.  Otherwise feels well.

## 2018-05-08 ENCOUNTER — Telehealth: Payer: Self-pay

## 2018-05-08 DIAGNOSIS — J4540 Moderate persistent asthma, uncomplicated: Secondary | ICD-10-CM | POA: Diagnosis not present

## 2018-05-08 DIAGNOSIS — Z5181 Encounter for therapeutic drug level monitoring: Secondary | ICD-10-CM

## 2018-05-08 DIAGNOSIS — E782 Mixed hyperlipidemia: Secondary | ICD-10-CM

## 2018-05-08 NOTE — Telephone Encounter (Signed)
Make sure she is aware that she is not booked for a "physical" in December If she needs a Medicare Wellness visit with Rosanne SackKasey, please schedule  Her last labs were drawn on June 20th, so I don't know if her insurance will pay for them to be drawn earlier than 6 months apart; just make sure she is aware of that  I'll let heme-onc take care of her CBC Her last TSH and vit D were normal  Please order: lipid panel  (hyperlipidemia) and CMP (med monitoring, both in problem list)

## 2018-05-08 NOTE — Telephone Encounter (Signed)
Left detailed voicemail

## 2018-05-08 NOTE — Telephone Encounter (Signed)
Copied from CRM [id number]. Topic: General - Other >> May 08, 2018 11:21 AM Alisha Hernandez, Tiffany M wrote: Patient requesting pre visit labs prior to 05/23/18 physical appointment, patient would like to discuss results at the time of appointment, please advise

## 2018-05-15 ENCOUNTER — Ambulatory Visit
Admission: RE | Admit: 2018-05-15 | Discharge: 2018-05-15 | Disposition: A | Discharge status: Home / Self Care | Payer: Medicare Other | Source: Ambulatory Visit | Attending: Family Medicine | Admitting: Family Medicine

## 2018-05-15 DIAGNOSIS — Z1231 Encounter for screening mammogram for malignant neoplasm of breast: Secondary | ICD-10-CM | POA: Insufficient documentation

## 2018-05-15 DIAGNOSIS — Z1239 Encounter for other screening for malignant neoplasm of breast: Secondary | ICD-10-CM

## 2018-05-23 ENCOUNTER — Encounter: Payer: Self-pay | Admitting: Family Medicine

## 2018-05-23 ENCOUNTER — Ambulatory Visit (INDEPENDENT_AMBULATORY_CARE_PROVIDER_SITE_OTHER): Payer: Medicare Other | Admitting: Family Medicine

## 2018-05-23 DIAGNOSIS — E782 Mixed hyperlipidemia: Secondary | ICD-10-CM | POA: Diagnosis not present

## 2018-05-23 DIAGNOSIS — Z833 Family history of diabetes mellitus: Secondary | ICD-10-CM | POA: Diagnosis not present

## 2018-05-23 DIAGNOSIS — Z5181 Encounter for therapeutic drug level monitoring: Secondary | ICD-10-CM | POA: Diagnosis not present

## 2018-05-23 DIAGNOSIS — Z1231 Encounter for screening mammogram for malignant neoplasm of breast: Secondary | ICD-10-CM

## 2018-05-23 DIAGNOSIS — I480 Paroxysmal atrial fibrillation: Secondary | ICD-10-CM

## 2018-05-23 DIAGNOSIS — M810 Age-related osteoporosis without current pathological fracture: Secondary | ICD-10-CM

## 2018-05-23 DIAGNOSIS — D72820 Lymphocytosis (symptomatic): Secondary | ICD-10-CM

## 2018-05-23 LAB — COMPLETE METABOLIC PANEL WITH GFR
AG RATIO: 1.5 (calc) (ref 1.0–2.5)
ALT: 16 U/L (ref 6–29)
AST: 21 U/L (ref 10–35)
Albumin: 4.5 g/dL (ref 3.6–5.1)
Alkaline phosphatase (APISO): 79 U/L (ref 33–130)
BILIRUBIN TOTAL: 0.5 mg/dL (ref 0.2–1.2)
BUN: 14 mg/dL (ref 7–25)
CHLORIDE: 103 mmol/L (ref 98–110)
CO2: 29 mmol/L (ref 20–32)
Calcium: 10.3 mg/dL (ref 8.6–10.4)
Creat: 0.67 mg/dL (ref 0.60–0.93)
GFR, EST AFRICAN AMERICAN: 98 mL/min/{1.73_m2} (ref >=60)
GFR, Est Non African American: 84 mL/min/{1.73_m2} (ref >=60)
GLOBULIN: 3 g/dL (ref 1.9–3.7)
Glucose, Bld: 93 mg/dL (ref 65–99)
POTASSIUM: 4.5 mmol/L (ref 3.5–5.3)
SODIUM: 140 mmol/L (ref 135–146)
Total Protein: 7.5 g/dL (ref 6.1–8.1)

## 2018-05-23 LAB — LIPID PANEL
Cholesterol: 146 mg/dL (ref <200)
HDL: 70 mg/dL (ref >50)
LDL Cholesterol (Calc): 58 mg/dL (calc)
Non-HDL Cholesterol (Calc): 76 mg/dL (calc) (ref <130)
TRIGLYCERIDES: 96 mg/dL (ref <150)
Total CHOL/HDL Ratio: 2.1 (calc) (ref <5.0)

## 2018-05-23 NOTE — Assessment & Plan Note (Addendum)
Check non-fasting glucose and follow-up if abnormal

## 2018-05-23 NOTE — Assessment & Plan Note (Signed)
Fall precautions, calcium and vitamin D; patient politely declined DEXA order; does not want fosamax or other medicines

## 2018-05-23 NOTE — Assessment & Plan Note (Signed)
Monitored by heme-onc 

## 2018-05-23 NOTE — Patient Instructions (Signed)
Try to limit saturated fats in your diet (bologna, hot dogs, barbeque, cheeseburgers, hamburgers, steak, bacon, sausage, cheese, etc.) and get more fresh fruits, vegetables, and whole grains   

## 2018-05-23 NOTE — Assessment & Plan Note (Addendum)
Check liver and kidneys, recent CBC reviewed, on xarelto

## 2018-05-23 NOTE — Progress Notes (Signed)
BP 108/62   Pulse 60   Temp 98 F (36.7 C)   Ht 5\' 5"  (1.651 m)   Wt 135 lb 12.8 oz (61.6 kg)   SpO2 98%   BMI 22.60 kg/m    Subjective:    Patient ID: Alisha Alisha Hernandez, female    DOB: February 02, 1941, 77 y.o.   MRN: [id number]  HPI: Alisha Alisha Hernandez is a 77 y.o. female  Chief Complaint  Patient presents with  . Follow-up    HPI Patient is here for f/u  Screening mammogram done on 05/15/18 IMPRESSION: No mammographic evidence of malignancy. A result letter of this screening mammogram will be mailed directly to the patient.  RECOMMENDATION: Screening mammogram in one year. (Code:[id number]-B-01Y)  BI-RADS CATEGORY  1: Negative.   Electronically Signed   By: Gerome Sam III M.D   On: 05/15/2018 15:25  She is overdue for a DEXA; she does not want to have a bone scan; she takes vitamins, but did not tolerate fosamax; diagnosed with osteoporosis years ago; she is very active; she does not want a DEXA and does not want to do anything until she gets a fracture (hoping that doesn't happen)  Sick with asthma, Dr. Sandy Salaam in [facility]; she was here first then saw the [facility] specialist; treated at first with an antibiotic and thought she was getting better, then got sick again, then came back and then treated with prednisone, still didn't work; then to [facility]; he did a 2nd round of steroids and a real strong antibiotic and it made her so sick, no diarrhea now; breathing is better; hurts over the top of her left shoulder, just when doing a lot  Taking tylenol PM instead of alprazolam; she quit taking the zyrtec  Atrial fibrillation; on cardizem and xarelto; sees heart doctor next week; no bleeding; avoiding caffeine and chocolate; intermittent  High cholesterol; appetite is not as good, does eat fatty foods  Alisha Hernandez Results  Component Value Date   CHOL 160 11/21/2017   HDL 77 11/21/2017   LDLCALC 69 11/21/2017   TRIG 60 11/21/2017   CHOLHDL 2.1 11/21/2017   Hx of family hx of diabetes,  so many in her family  Seeing hematologist; lymphocytosis; cbc just done  Depression screen [facility] 2/9 05/23/2018 02/28/2018 11/21/2017 11/21/2017 05/23/2017  Decreased Interest 0 0 0 0 0  Down, Depressed, Hopeless 0 0 0 0 0  PHQ - 2 Score 0 0 0 0 0  Altered sleeping 0 0 1 - -  Tired, decreased energy 0 0 3 - -  Change in appetite 0 0 0 - -  Feeling bad or failure about yourself  0 0 0 - -  Trouble concentrating 0 0 1 - -  Moving slowly or fidgety/restless 0 0 0 - -  Suicidal thoughts 0 0 0 - -  PHQ-9 Score 0 0 5 - -  Difficult doing work/chores Not difficult at all Not difficult at all Not difficult at all - -   Fall Risk  05/23/2018 03/28/2018 03/05/2018 02/28/2018 11/21/2017  Falls in the past year? 0 No No No No  Number falls in past yr: - - - - -  Injury with Fall? - - - - -  Risk for fall due to : - - - Impaired vision -  Risk for fall due to: Comment - - - wears eyeglasses; macular degeneration -    Relevant past medical, surgical, family and social history reviewed Past Medical History:  Diagnosis Date  .  Anxiety   . Asthma   . Atrial fibrillation ([facility])   . C. difficile colitis   . Heart murmur   . Hypercholesteremia   . Osteoporosis   . Pseudomembranous colitis    Past Surgical History:  Procedure Laterality Date  . ABDOMINAL HYSTERECTOMY    . NASAL SINUS SURGERY     Family History  Problem Relation Age of Onset  . Heart attack Mother   . Heart disease Mother   . Cancer Mother        breast  . Hypertension Mother   . Breast cancer Mother [phone]  . Cancer Father        skin and prostate  . Cancer Sister        lymphoma  . Diabetes Sister   . Stroke Sister   . Dementia Sister   . Diabetes Brother   . COPD Neg Hx    Social History   Tobacco Use  . Smoking status: Never Smoker  . Smokeless tobacco: Never Used  . Tobacco comment: smoking cessation materials not required  Substance Use Topics  . Alcohol use: No  . Drug use: No     [facility]  AUDIT-C Score  0      Interim medical history since last visit reviewed. Allergies and medications reviewed  Review of Systems  Gastrointestinal: Negative for blood in stool.  Genitourinary: Negative for hematuria.   Per HPI unless specifically indicated above     Objective:    BP 108/62   Pulse 60   Temp 98 F (36.7 C)   Ht 5\' 5"  (1.651 m)   Wt 135 lb 12.8 oz (61.6 kg)   SpO2 98%   BMI 22.60 kg/m   Wt Readings from Last 3 Encounters:  05/23/18 135 lb 12.8 oz (61.6 kg)  04/08/18 136 lb 6.4 oz (61.9 kg)  03/28/18 132 lb 12.8 oz (60.2 kg)    Physical Exam Constitutional:      General: She is not in acute distress.    Appearance: She is well-developed. She is not diaphoretic.  HENT:     Head: Normocephalic and atraumatic.  Eyes:     General: No scleral icterus. Neck:     Thyroid: No thyromegaly.  Cardiovascular:     Rate and Rhythm: Normal rate and regular rhythm.     Heart sounds: Normal heart sounds. No murmur.  Pulmonary:     Effort: Pulmonary effort is normal. No respiratory distress.     Breath sounds: Normal breath sounds. No wheezing.  Abdominal:     General: There is no distension.     Palpations: Abdomen is soft.  Skin:    General: Skin is warm and dry.     Coloration: Skin is not pale.  Neurological:     Mental Status: She is alert.  Psychiatric:        Behavior: Behavior normal.        Thought Content: Thought content normal.        Judgment: Judgment normal.     Results for orders placed or performed in visit on 03/25/18  Comp panel: Leukemia/Lymphoma  Result Value Ref Range   PATH INTERP XXX-IMP Comment    ANNOTATION COMMENT IMP Comment    CLINICAL INFO 22    Specimen Type Comment    ASSESSMENT OF LEUKOCYTES Comment    % Viable Cells Comment    Immunophenotypic Profile 1% of total cells (Phenotype below)  ANALYSIS AND GATING STRATEGY Comment    IMMUNOPHENOTYPING STUDY Comment     PATHOLOGIST NAME Comment    COMMENT: Comment   CBC with Differential/Platelet  Result Value Ref Range   WBC 3.8 (L) 4.0 - 10.5 K/uL   RBC 3.98 3.87 - 5.11 MIL/uL   Hemoglobin 12.7 12.0 - 15.0 g/dL   HCT 16.138.0 09.636.0 - 04.546.0 %   MCV 95.5 80.0 - 100.0 fL   MCH 31.9 26.0 - 34.0 pg   MCHC 33.4 30.0 - 36.0 g/dL   RDW 40.912.8 81.111.5 - 91.415.5 %   Platelets 212 150 - 400 K/uL   nRBC 0.0 0.0 - 0.2 %   Neutrophils Relative % 43 %   Neutro Abs 1.6 (L) 1.7 - 7.7 K/uL   Lymphocytes Relative 40 %   Lymphs Abs 1.5 0.7 - 4.0 K/uL   Monocytes Relative 14 %   Monocytes Absolute 0.5 0.1 - 1.0 K/uL   Eosinophils Relative 3 %   Eosinophils Absolute 0.1 0.0 - 0.5 K/uL   Basophils Relative 0 %   Basophils Absolute 0.0 0.0 - 0.1 K/uL   Immature Granulocytes 0 %   Abs Immature Granulocytes 0.01 0.00 - 0.07 K/uL      Assessment & Plan:   Problem List Items Addressed This Visit      Cardiovascular and Mediastinum   A-fib (HCC)    Intermittent; seeing cardiologist next week; on anticoagulation and CCB        Musculoskeletal and Integument   Osteoporosis    Fall precautions, calcium and vitamin D; patient politely declined DEXA order; does not want fosamax or other medicines        Other   Visit for screening mammogram    Next mammo in one year; reviewed with patient      Medication monitoring encounter    Check liver and kidneys, check CBC on xarelto      Relevant Orders   CBC with Differential/Platelet   COMPLETE METABOLIC PANEL WITH GFR   HLD (hyperlipidemia)    Check lipids today; continue statin; limit saturated fats      Relevant Orders   Lipid panel   Family history of diabetes mellitus    Check non-fasting glucose and follow-up if abnormal          Follow up plan: No follow-ups on file.  An after-visit summary was printed and given to the patient at check-out.  Please see the patient instructions which may contain other information and recommendations beyond what is mentioned  above in the assessment and plan.  No orders of the defined types were placed in this encounter.   Orders Placed This Encounter  Procedures  . CBC with Differential/Platelet  . COMPLETE METABOLIC PANEL WITH GFR  . Lipid panel

## 2018-05-23 NOTE — Assessment & Plan Note (Signed)
Next mammo in one year; reviewed with patient

## 2018-05-23 NOTE — Assessment & Plan Note (Signed)
Intermittent; seeing cardiologist next week; on anticoagulation and CCB

## 2018-05-23 NOTE — Assessment & Plan Note (Signed)
Check lipids today; continue statin; limit saturated fats 

## 2018-05-30 DIAGNOSIS — I480 Paroxysmal atrial fibrillation: Secondary | ICD-10-CM | POA: Diagnosis not present

## 2018-05-30 DIAGNOSIS — I491 Atrial premature depolarization: Secondary | ICD-10-CM | POA: Diagnosis not present

## 2018-05-30 DIAGNOSIS — E785 Hyperlipidemia, unspecified: Secondary | ICD-10-CM | POA: Diagnosis not present

## 2018-06-08 DIAGNOSIS — J4540 Moderate persistent asthma, uncomplicated: Secondary | ICD-10-CM | POA: Diagnosis not present

## 2018-06-27 ENCOUNTER — Ambulatory Visit: Payer: Self-pay

## 2018-06-30 ENCOUNTER — Encounter: Payer: Self-pay | Admitting: Family Medicine

## 2018-06-30 ENCOUNTER — Ambulatory Visit (INDEPENDENT_AMBULATORY_CARE_PROVIDER_SITE_OTHER): Payer: Medicare Other | Admitting: Family Medicine

## 2018-06-30 DIAGNOSIS — G4700 Insomnia, unspecified: Secondary | ICD-10-CM | POA: Diagnosis not present

## 2018-06-30 NOTE — Assessment & Plan Note (Signed)
Discussed good sleep hygiene, no electronics for 2 hours before bed; no caffeine, tea, or chocolate for 10 hours before bed; recommended CBT-insomnia; recommended NO benzodiazepines because of the risk (falls, memory, etc); offered referral to sleep specialist, open invitation for referral if needed

## 2018-06-30 NOTE — Progress Notes (Signed)
BP 128/62   Pulse 62   Temp 98 F (36.7 C) (Oral)   Resp 14   Wt 137 lb 1.6 oz (62.2 kg)   SpO2 93%   BMI 22.81 kg/m    Subjective:    Patient ID: Alisha Hernandez, female    DOB: [birth year], 78 y.o.   MRN: [id number]  HPI: Alisha Hernandez is a 78 y.o. female  Chief Complaint  Patient presents with  . Insomnia    patient states trouble going and staying asleep.  Was told not to take tylenol pm    HPI Patient asked a staff member recently if it was okay to take Tylenol PM for sleep and my short answer was "no" so she is here to discuss  She says she has gotten to where she can't go to sleep; she used to take Xanax from Dr. Darrold Junker; last time she had it filled was in April of last year; got 30 pills and still has some; it made her feel groggy; she switched over to Tylenol PM  She started to have problems with sleep all her life; the Xanax just helps when she takes it; she has not taken that a whole lot; she says "you feel like you'll take anything to get some sleep"; she will fall asleep and then wake up around 3 or 4 am; sometimes she will go to bed and cannot fall asleep  She has really reduced all the tea and coffee; she drank tea around 4 pm on Friday but slept good that night; she does not eat much chocolate No TV in the bedroom; she just reads No sexual or physical trauma when younger No shift work No late afternoon or evening exercise  She goes to bed at the same time every night; she does not snore much; does not stop breathing in her sleep She has had dry mouth, but had the heat going so high in her bedroom; she added a humidifier and it's been fine since then  No head trauma, no prolonged coma  She was having some issues with her breathing, asthma lately; cold air really got to her; still wheezing, "but that's not why I'm here"; back to being herself she says  Depression screen [facility] 2/9 06/30/2018 05/23/2018 02/28/2018 11/21/2017 11/21/2017  Decreased Interest 0 0 0 0 0    Down, Depressed, Hopeless 1 0 0 0 0  PHQ - 2 Score 1 0 0 0 0  Altered sleeping 2 0 0 1 -  Tired, decreased energy 3 0 0 3 -  Change in appetite 0 0 0 0 -  Feeling bad or failure about yourself  0 0 0 0 -  Trouble concentrating 0 0 0 1 -  Moving slowly or fidgety/restless 0 0 0 0 -  Suicidal thoughts 0 0 0 0 -  PHQ-9 Score 6 0 0 5 -  Difficult doing work/chores Not difficult at all Not difficult at all Not difficult at all Not difficult at all -   Fall Risk  06/30/2018 05/23/2018 03/28/2018 03/05/2018 02/28/2018  Falls in the past year? 0 0 No No No  Number falls in past yr: 0 - - - -  Injury with Fall? 0 - - - -  Risk for fall due to : - - - - Impaired vision  Risk for fall due to: Comment - - - - wears eyeglasses; macular degeneration    Relevant past medical, surgical, family and social history reviewed Past Medical History:  Diagnosis Date  . Anxiety   . Asthma   . Atrial fibrillation (HCC)   . C. difficile colitis   . Heart murmur   . Hypercholesteremia   . Osteoporosis   . Pseudomembranous colitis    Past Surgical History:  Procedure Laterality Date  . ABDOMINAL HYSTERECTOMY    . NASAL SINUS SURGERY     Family History  Problem Relation Age of Onset  . Heart attack Mother   . Heart disease Mother   . Cancer Mother        breast  . Hypertension Mother   . Breast cancer Mother 48  . Cancer Father        skin and prostate  . Cancer Sister        lymphoma  . Diabetes Sister   . Stroke Sister   . Dementia Sister   . Diabetes Brother   . Dementia Sister   . COPD Neg Hx    Social History   Tobacco Use  . Smoking status: Never Smoker  . Smokeless tobacco: Never Used  . Tobacco comment: smoking cessation materials not required  Substance Use Topics  . Alcohol use: No  . Drug use: No     Office Visit from 06/30/2018 in [facility]  AUDIT-C Score  0      Interim medical history since last visit reviewed. Allergies and medications  reviewed  Review of Systems Per HPI unless specifically indicated above     Objective:    BP 128/62   Pulse 62   Temp 98 F (36.7 C) (Oral)   Resp 14   Wt 137 lb 1.6 oz (62.2 kg)   SpO2 93%   BMI 22.81 kg/m   Wt Readings from Last 3 Encounters:  06/30/18 137 lb 1.6 oz (62.2 kg)  05/23/18 135 lb 12.8 oz (61.6 kg)  04/08/18 136 lb 6.4 oz (61.9 kg)    Physical Exam Constitutional:      General: She is not in acute distress.    Appearance: She is well-developed.  Eyes:     General: No scleral icterus. Neck:     Thyroid: No thyromegaly.  Cardiovascular:     Rate and Rhythm: Normal rate.  Pulmonary:     Effort: Pulmonary effort is normal.  Skin:    Coloration: Skin is not pale.  Psychiatric:        Behavior: Behavior normal.        Thought Content: Thought content normal.        Judgment: Judgment normal.       Assessment & Plan:   Problem List Items Addressed This Visit      Other   Insomnia    Discussed good sleep hygiene, no electronics for 2 hours before bed; no caffeine, tea, or chocolate for 10 hours before bed; recommended CBT-insomnia; recommended NO benzodiazepines because of the risk (falls, memory, etc); offered referral to sleep specialist, open invitation for referral if needed          Follow up plan: Return if symptoms worsen or fail to improve.  An after-visit summary was printed and given to the patient at check-out.  Please see the patient instructions which may contain other information and recommendations beyond what is mentioned above in the assessment and plan.  Face-to-face time with patient was more than 15 minutes, >50% time spent counseling and coordination of care

## 2018-06-30 NOTE — Patient Instructions (Signed)

## 2018-07-09 DIAGNOSIS — J4540 Moderate persistent asthma, uncomplicated: Secondary | ICD-10-CM | POA: Diagnosis not present

## 2018-08-07 DIAGNOSIS — J4540 Moderate persistent asthma, uncomplicated: Secondary | ICD-10-CM | POA: Diagnosis not present

## 2018-09-07 DIAGNOSIS — J4540 Moderate persistent asthma, uncomplicated: Secondary | ICD-10-CM | POA: Diagnosis not present

## 2018-10-07 DIAGNOSIS — J4540 Moderate persistent asthma, uncomplicated: Secondary | ICD-10-CM | POA: Diagnosis not present

## 2018-11-07 DIAGNOSIS — J4540 Moderate persistent asthma, uncomplicated: Secondary | ICD-10-CM | POA: Diagnosis not present

## 2018-11-25 ENCOUNTER — Ambulatory Visit: Payer: Medicare Other | Admitting: Family Medicine

## 2018-11-26 DIAGNOSIS — I480 Paroxysmal atrial fibrillation: Secondary | ICD-10-CM | POA: Diagnosis not present

## 2018-12-07 DIAGNOSIS — J4540 Moderate persistent asthma, uncomplicated: Secondary | ICD-10-CM | POA: Diagnosis not present

## 2018-12-20 DIAGNOSIS — J029 Acute pharyngitis, unspecified: Secondary | ICD-10-CM | POA: Diagnosis not present

## 2018-12-21 ENCOUNTER — Telehealth: Payer: Self-pay | Admitting: Family Medicine

## 2018-12-22 NOTE — Telephone Encounter (Signed)
Pt will call back to schedule once she feels better

## 2018-12-22 NOTE — Telephone Encounter (Signed)
Please schedule routine follow-up in 3 months  

## 2019-01-07 DIAGNOSIS — J4540 Moderate persistent asthma, uncomplicated: Secondary | ICD-10-CM | POA: Diagnosis not present

## 2019-01-13 ENCOUNTER — Ambulatory Visit (INDEPENDENT_AMBULATORY_CARE_PROVIDER_SITE_OTHER): Payer: Medicare Other | Admitting: Nurse Practitioner

## 2019-01-13 ENCOUNTER — Other Ambulatory Visit: Payer: Self-pay

## 2019-01-13 ENCOUNTER — Encounter: Payer: Self-pay | Admitting: Nurse Practitioner

## 2019-01-13 VITALS — BP 132/72 | HR 59 | Temp 96.6°F | Resp 14 | Ht 62.0 in | Wt 131.2 lb

## 2019-01-13 DIAGNOSIS — R438 Other disturbances of smell and taste: Secondary | ICD-10-CM

## 2019-01-13 DIAGNOSIS — R110 Nausea: Secondary | ICD-10-CM

## 2019-01-13 DIAGNOSIS — E782 Mixed hyperlipidemia: Secondary | ICD-10-CM | POA: Diagnosis not present

## 2019-01-13 DIAGNOSIS — R634 Abnormal weight loss: Secondary | ICD-10-CM

## 2019-01-13 DIAGNOSIS — R5383 Other fatigue: Secondary | ICD-10-CM

## 2019-01-13 NOTE — Progress Notes (Signed)
Name: Alisha Hernandez   MRN: [id number]    DOB: 24-Oct-1940   Date:01/13/2019       Progress Note  Subjective  Chief Complaint  Chief Complaint  Patient presents with  . Weight Loss    Pt states about a total of 12pds in month, she states food tatse bad to her    HPI  Patient states about 5 weeks ago started to have a sore throat lasted a few days and resolved and then developed bad taste in her mouth. States when she eats food it has a different taste then normal and she constantly has bitter taste- no loss of taste but different taste and decreased appetite because of this. States she has some nausea when she takes her medications in the morning. Endorses mild increase in fatigue over the past five weakness. Denies pain, dysphagia, fevers, chills, abdominal pain, vomiting, blood in stools, joint pain or stiffness, myalgias, shortness of breath, cough, change in sense of smell. Last colonoscopy has been over 10 years- never had polpys. She has daily bowel movements without straining. Never smoker. No known positive COVID contacts.   She states the taste in her mouth is similar to when she had E. coli colitis but has had no changes in her bowel movements.  She has been seeing Dr. Orlie [facility] oncology for monoclonal B-cell lymphocytosis annually with relatively normal work-up.  States she was using Neti pot frequently in the past for sinus congestion and had surgery with ENT for mold in her sinuses.  Has not used this since denies sinus pain, pressure or nasal congestion.  Wt Readings from Last 3 Encounters:  01/13/19 131 lb 3.2 oz (59.5 kg)  06/30/18 137 lb 1.6 oz (62.2 kg)  05/23/18 135 lb 12.8 oz (61.6 kg)    PHQ2/9: Depression screen [facility] 01/13/2019 06/30/2018 05/23/2018 02/28/2018 11/21/2017  Decreased Interest 0 0 0 0 0  Down, Depressed, Hopeless 1 1 0 0 0  PHQ - 2 Score 1 1 0 0 0  Altered sleeping 0 2 0 0 1  Tired, decreased energy 0 3 0 0 3  Change in appetite 0 0 0 0 0  Feeling bad or  failure about yourself  0 0 0 0 0  Trouble concentrating 0 0 0 0 1  Moving slowly or fidgety/restless 0 0 0 0 0  Suicidal thoughts 0 0 0 0 0  PHQ-9 Score 1 6 0 0 5  Difficult doing work/chores Not difficult at all Not difficult at all Not difficult at all Not difficult at all Not difficult at all     PHQ reviewed. Negative  Patient Active Problem List   Diagnosis Date Noted  . Insomnia 06/30/2018  . Family history of diabetes mellitus 05/23/2018  . Allergic rhinitis 11/21/2017  . 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 Lichen sclerosus 12/24/2016  . Bursitis of hip 12/04/2016  . Monoclonal B-cell lymphocytosis of unknown significance 03/11/2016  . Visit for screening mammogram 02/23/2016  . Bright red rectal bleeding 02/23/2016  . Urinary frequency 02/23/2016  . Stool guaiac positive 04/19/2015  . Fatigue 04/18/2015  . Anticoagulant long-term use 04/18/2015  . Dizziness 03/16/2015  . Medication monitoring encounter 03/16/2015  . Hyperreflexia of lower extremity 03/16/2015  . Pseudomembranous colitis 02/18/2015  . Osteoporosis 02/18/2015  . Asthma 02/18/2015  . HLD (hyperlipidemia) 02/25/2014  . APC (atrial premature contractions) 02/25/2014  . Anxiety 02/25/2014  . A-fib (HCC) 06/30/2012    Past Medical History:  Diagnosis Date  .  Anxiety   . Asthma   . Atrial fibrillation (HCC)   . C. difficile colitis   . Heart murmur   . Hypercholesteremia   . Osteoporosis   . Pseudomembranous colitis     Past Surgical History:  Procedure Laterality Date  . ABDOMINAL HYSTERECTOMY    . NASAL SINUS SURGERY      Social History   Tobacco Use  . Smoking status: Never Smoker  . Smokeless tobacco: Never Used  . Tobacco comment: smoking cessation materials not required  Substance Use Topics  . Alcohol use: No     Current Outpatient Medications:  .  albuterol (PROAIR HFA) 108 (90 Base) MCG/ACT inhaler, Inhale 2 puffs into the lungs every 6 (six) hours as  needed for wheezing or shortness of breath., Disp: 1 Inhaler, Rfl: 0 .  atorvastatin (LIPITOR) 20 MG tablet, TAKE 1 TABLET BY MOUTH AT  BEDTIME, Disp: 90 tablet, Rfl: 1 .  cholecalciferol (VITAMIN D) 1000 UNITS tablet, Take 1,000 Units by mouth daily., Disp: , Rfl:  .  diphenhydramine-acetaminophen (TYLENOL PM) 25-500 MG TABS tablet, Take 1 tablet by mouth at bedtime as needed., Disp: , Rfl:  .  Multiple Vitamins-Minerals (MULTIVITAMIN WOMEN 50+ PO), , Disp: , Rfl:  .  rivaroxaban (XARELTO) 20 MG TABS tablet, Take 20 mg by mouth daily with supper. Reported on 07/27/2015, Disp: , Rfl:  .  sertraline (ZOLOFT) 50 MG tablet, TAKE 1 TABLET BY MOUTH  DAILY, Disp: 90 tablet, Rfl: 3 .  triamcinolone ointment (KENALOG) 0.1 %, Apply 1 application topically as needed. Apply BID x 2 weeks, HS x 2 weeks, M-W-F x 2 weeks, Disp: 30 g, Rfl: 0 .  ADVAIR DISKUS 250-50 MCG/DOSE AEPB, Inhale 1 puff into the lungs 2 (two) times daily. *Brand name medically necessary, dispense as written for ADVAIR* (Patient not taking: Reported on 01/13/2019), Disp: 180 each, Rfl: 3 .  ALPRAZolam (XANAX) 0.25 MG tablet, Take 0.25 mg by mouth at bedtime as needed., Disp: , Rfl:  .  Ascorbic Acid (VITAMIN C) 100 MG tablet, Take 100 mg by mouth daily., Disp: , Rfl:  .  CARTIA XT 120 MG 24 hr capsule, Take 120 mg by mouth daily., Disp: , Rfl:  .  diltiazem (CARDIZEM) 60 MG tablet, Take 60 mg by mouth as needed. If heart starts racing, Disp: , Rfl:  .  nitroGLYCERIN (NITROSTAT) 0.4 MG SL tablet, Place 1 tablet under the tongue every 5 (five) minutes as needed. For chest pain, Disp: , Rfl:   Allergies  Allergen Reactions  . Penicillin G Other (See Comments)    Pt. Can't remember the side effect, but has always not taken it.  . Cefdinir Nausea And Vomiting  . Levofloxacin Nausea And Vomiting    racing heart    ROS    No other specific complaints in a complete review of systems (except as listed in HPI above).  Objective  Vitals:    01/13/19 1522  BP: 132/72  Pulse: (!) 59  Resp: 14  Temp: (!) 96.6 F (35.9 C)  SpO2: 96%  Weight: 131 lb 3.2 oz (59.5 kg)  Height: 5\' 2"  (1.575 m)     Body mass index is 24 kg/m.  Nursing Note and Vital Signs reviewed.  Physical Exam Constitutional:      General: She is not in acute distress.    Appearance: Normal appearance. She is normal weight. She is not ill-appearing, toxic-appearing or diaphoretic.  HENT:     Head: Normocephalic and atraumatic.  Nose: Nose normal.     Right Sinus: No maxillary sinus tenderness or frontal sinus tenderness.     Left Sinus: No maxillary sinus tenderness or frontal sinus tenderness.     Mouth/Throat:     Mouth: Mucous membranes are moist.     Dentition: Abnormal dentition (has multiple fillings). No dental tenderness, gingival swelling, dental abscesses or gum lesions.     Pharynx: Oropharynx is clear. No oropharyngeal exudate or posterior oropharyngeal erythema.  Eyes:     Conjunctiva/sclera: Conjunctivae normal.     Pupils: Pupils are equal, round, and reactive to light.  Cardiovascular:     Rate and Rhythm: Bradycardia present.     Pulses: Normal pulses.  Pulmonary:     Effort: Pulmonary effort is normal.     Breath sounds: Normal breath sounds.  Abdominal:     General: Abdomen is flat. Bowel sounds are normal. There is no distension.     Palpations: Abdomen is soft.     Tenderness: There is no abdominal tenderness. There is no right CVA tenderness, left CVA tenderness or guarding.  Musculoskeletal: Normal range of motion.     Right lower leg: No edema.     Left lower leg: No edema.  Skin:    General: Skin is warm and dry.     Findings: No rash.  Neurological:     General: No focal deficit present.     Mental Status: She is alert and oriented to person, place, and time.     Motor: No weakness.  Psychiatric:        Mood and Affect: Mood normal.        Behavior: Behavior normal.       No results found for this or  any previous visit (from the past 48 hour(s)).  Assessment & Plan  1. Unexplained weight loss Due to advanced age this can be caused by numerous factors, patient denies depression and states is been a little sadness the pandemic is changed lifestyle but nothing severe.  Patient has decreased appetite due to foul taste which is likely cause of weight loss will further investigate this with labs and H. pylori breath test.  Patient has had mild sinus infection sees Dr. Richardson [facility] will refer there for further follow-up if needed.  Consider GI follow-up as well and CRP and sed rate and further work-up if needed.  Patient will follow-up in 1 month.  Discussed nutritional supplements due to decreased weight loss. - TSH - COMPLETE METABOLIC PANEL WITH GFR - H. pylori breath test 2. Fatigue, unspecified type - CBC with Differential - TSH - COMPLETE METABOLIC PANEL WITH GFR  3. Bad oral taste - H. pylori breath test  4. Nausea - H. pylori breath test  5. Mixed hyperlipidemia - Lipid Profile

## 2019-01-13 NOTE — Patient Instructions (Signed)
Please schedule an appointment to follow-up with your dentist within the next month or so. You should receive a phone call from [place] within the next week to discuss your results from today and to come up with a plan of care, based off your results we may have you follow-up with ENT or a GI doctor.  We will follow-up with you in a month to discuss your chronic conditions as well as further assess your weight loss. It is important to make sure you have proper nutrition, if you are having decreased appetite you may want to add on a nutritional supplement such as boost or Ensure's shakes a few times a week to make sure you are getting all the nutrients you need.   Fatigue If you have fatigue, you feel tired all the time and have a lack of energy or a lack of motivation. Fatigue may make it difficult to start or complete tasks because of exhaustion. In general, occasional or mild fatigue is often a normal response to activity or life. However, long-lasting (chronic) or extreme fatigue may be a symptom of a medical condition. Follow these instructions at home: General instructions  Watch your fatigue for any changes.  Go to bed and get up at the same time every day.  Avoid fatigue by pacing yourself during the day and getting enough sleep at night.  Maintain a healthy weight. Medicines  Take over-the-counter and prescription medicines only as told by your health care provider.  Take a multivitamin, if told by your health care provider.  Do not use herbal or dietary supplements unless they are approved by your health care provider. Activity  Exercise regularly, as told by your health care provider.  Use or practice techniques to help you relax, such as yoga, tai chi, meditation, or massage therapy. Eating and drinking   Avoid heavy meals in the evening.  Eat a well-balanced diet, which includes lean proteins, whole grains, plenty of fruits and vegetables, and low-fat dairy  products.  Avoid consuming too much caffeine.  Avoid the use of alcohol.  Drink enough fluid to keep your urine pale yellow. Lifestyle  Change situations that cause you stress. Try to keep your work and personal schedule in balance.  Do not use any products that contain nicotine or tobacco, such as cigarettes and e-cigarettes. If you need help quitting, ask your health care provider.  Do not use drugs. Contact a health care provider if:  Your fatigue does not get better.  You have a fever.  You suddenly lose or gain weight.  You have headaches.  You have trouble falling asleep or sleeping through the night.  You feel angry, guilty, anxious, or sad.  You are unable to have a bowel movement (constipation).  Your skin is dry.  You have swelling in your legs or another part of your body. Get help right away if:  You feel confused.  Your vision is blurry.  You feel faint or you pass out.  You have a severe headache.  You have severe pain in your abdomen, your back, or the area between your waist and hips (pelvis).  You have chest pain, shortness of breath, or an irregular or fast heartbeat.  You are unable to urinate, or you urinate less than normal.  You have abnormal bleeding, such as bleeding from the rectum, vagina, nose, lungs, or nipples.  You vomit blood.  You have thoughts about hurting yourself or others. If you ever feel like you may hurt  yourself or others, or have thoughts about taking your own life, get help right away. You can go to your nearest emergency department or call:  Your local emergency services (911 in the [place]  A suicide crisis helpline, such as the [place] at (415) 589-5973. This is open 24 hours a day. Summary  If you have fatigue, you feel tired all the time and have a lack of energy or a lack of motivation.  Fatigue may make it difficult to start or complete tasks because of  exhaustion.  Long-lasting (chronic) or extreme fatigue may be a symptom of a medical condition.  Exercise regularly, as told by your health care provider.  Change situations that cause you stress. Try to keep your work and personal schedule in balance. This information is not intended to replace advice given to you by your health care provider. Make sure you discuss any questions you have with your health care provider. Document Released: 03/18/2007 Document Revised: 09/11/2018 Document Reviewed: 02/13/2017 Elsevier Patient Education  2020 [employer].

## 2019-01-14 LAB — CBC WITH DIFFERENTIAL/PLATELET
Absolute Monocytes: 400 cells/uL (ref 200–950)
Basophils Absolute: 11 cells/uL (ref 0–200)
Basophils Relative: 0.3 %
Eosinophils Absolute: 111 cells/uL (ref 15–500)
Eosinophils Relative: 3 %
HCT: 38.9 % (ref 35.0–45.0)
Hemoglobin: 13.5 g/dL (ref 11.7–15.5)
Lymphs Abs: 1506 cells/uL (ref 850–3900)
MCH: 32.9 pg (ref 27.0–33.0)
MCHC: 34.7 g/dL (ref 32.0–36.0)
MCV: 94.9 fL (ref 80.0–100.0)
MPV: 11.7 fL (ref 7.5–12.5)
Monocytes Relative: 10.8 %
Neutro Abs: 1672 cells/uL (ref 1500–7800)
Neutrophils Relative %: 45.2 %
Platelets: 193 10*3/uL (ref 140–400)
RBC: 4.1 10*6/uL (ref 3.80–5.10)
RDW: 12.8 % (ref 11.0–15.0)
Total Lymphocyte: 40.7 %
WBC: 3.7 10*3/uL — ABNORMAL LOW (ref 3.8–10.8)

## 2019-01-14 LAB — LIPID PANEL
Cholesterol: 148 mg/dL (ref <200)
HDL: 62 mg/dL (ref >=50)
LDL Cholesterol (Calc): 65 mg/dL (calc)
Non-HDL Cholesterol (Calc): 86 mg/dL (calc) (ref <130)
Total CHOL/HDL Ratio: 2.4 (calc) (ref <5.0)
Triglycerides: 124 mg/dL (ref <150)

## 2019-01-14 LAB — COMPLETE METABOLIC PANEL WITH GFR
AG Ratio: 1.6 (calc) (ref 1.0–2.5)
ALT: 12 U/L (ref 6–29)
AST: 18 U/L (ref 10–35)
Albumin: 4.5 g/dL (ref 3.6–5.1)
Alkaline phosphatase (APISO): 70 U/L (ref 37–153)
BUN: 15 mg/dL (ref 7–25)
CO2: 30 mmol/L (ref 20–32)
Calcium: 9.8 mg/dL (ref 8.6–10.4)
Chloride: 103 mmol/L (ref 98–110)
Creat: 0.63 mg/dL (ref 0.60–0.93)
GFR, Est African American: 99 mL/min/{1.73_m2} (ref >=60)
GFR, Est Non African American: 85 mL/min/{1.73_m2} (ref >=60)
Globulin: 2.9 g/dL (calc) (ref 1.9–3.7)
Glucose, Bld: 86 mg/dL (ref 65–99)
Potassium: 4.2 mmol/L (ref 3.5–5.3)
Sodium: 138 mmol/L (ref 135–146)
Total Bilirubin: 0.5 mg/dL (ref 0.2–1.2)
Total Protein: 7.4 g/dL (ref 6.1–8.1)

## 2019-01-14 LAB — TSH: TSH: 0.71 mIU/L (ref 0.40–4.50)

## 2019-01-14 LAB — H. PYLORI BREATH TEST: H. pylori Breath Test: NOT DETECTED

## 2019-01-22 DIAGNOSIS — R439 Unspecified disturbances of smell and taste: Secondary | ICD-10-CM | POA: Diagnosis not present

## 2019-01-22 DIAGNOSIS — J310 Chronic rhinitis: Secondary | ICD-10-CM | POA: Diagnosis not present

## 2019-01-22 DIAGNOSIS — J019 Acute sinusitis, unspecified: Secondary | ICD-10-CM | POA: Diagnosis not present

## 2019-01-26 ENCOUNTER — Other Ambulatory Visit: Payer: Self-pay | Admitting: Family Medicine

## 2019-02-13 ENCOUNTER — Ambulatory Visit: Payer: Medicare Other | Admitting: Family Medicine

## 2019-03-05 ENCOUNTER — Ambulatory Visit: Payer: Medicare Other

## 2019-03-06 ENCOUNTER — Ambulatory Visit (INDEPENDENT_AMBULATORY_CARE_PROVIDER_SITE_OTHER): Payer: Medicare Other

## 2019-03-06 VITALS — Ht 65.0 in | Wt 132.0 lb

## 2019-03-06 DIAGNOSIS — Z0000 Encounter for general adult medical examination without abnormal findings: Secondary | ICD-10-CM | POA: Diagnosis not present

## 2019-03-06 DIAGNOSIS — Z1231 Encounter for screening mammogram for malignant neoplasm of breast: Secondary | ICD-10-CM

## 2019-03-06 NOTE — Progress Notes (Signed)
Subjective:   Alisha Hernandez is a 78 y.o. female who presents for Medicare Annual (Subsequent) preventive examination.  Virtual Visit via Telephone Note  I connected with Alisha Hernandez on 03/06/19 at  9:20 AM EDT by telephone and verified that I am speaking with the correct person using two identifiers.  Medicare Annual Wellness visit completed telephonically due to Covid-19 pandemic.   Location: Patient: home Provider: office   I discussed the limitations, risks, security and privacy concerns of performing an evaluation and management service by telephone and the availability of in person appointments. The patient expressed understanding and agreed to proceed.  Some vital signs may be absent or patient reported.   Reather Littler, LPN    Review of Systems:   Cardiac Risk Factors include: advanced age (>44men, >20 women);dyslipidemia     Objective:     Vitals: Ht  (1.651 m)    Wt 132 lb (59.9 kg)    BMI 21.97 kg/m   Body mass index is 21.97 kg/m.  Advanced Directives 03/06/2019 04/08/2018 02/28/2018 03/26/2017 02/21/2017 02/03/2017 01/29/2017  Does Patient Have a Medical Advance Directive? Yes Yes Yes Yes Yes Yes Yes  Type of Estate agent of [place];Living will Healthcare Power of [place];Living will Healthcare Power of [place];Living will Living will;Healthcare Power of [employer] Power of [place];Living will Healthcare Power of [place];Living will -  Does patient want to make changes to medical advance directive? - - - - - No - Patient declined -  Copy of Healthcare Power of Attorney in Chart? No - copy requested No - copy requested No - copy requested - No - copy requested No - copy requested -  Would patient like information on creating a medical advance directive? - - - - - No - Patient declined -    Tobacco Social History   Tobacco Use  Smoking Status Never Smoker  Smokeless Tobacco Never Used  Tobacco Comment   smoking cessation  materials not required     Counseling given: Not Answered Comment: smoking cessation materials not required   Clinical Intake:  Pre-visit preparation completed: Yes  Pain : 0-10 Pain Score: 3  Pain Type: Acute pain Pain Location: Rib cage Pain Descriptors / Indicators: Discomfort Pain Onset: 1 to 4 weeks ago Pain Frequency: Intermittent     BMI - recorded: 21.97 Nutritional Status: BMI of 19-24  Normal Diabetes: No  How often do you need to have someone help you when you read instructions, pamphlets, or other written materials from your doctor or pharmacy?: 1 - Never  Interpreter Needed?: No  Information entered by :: Reather Littler LPN  Past Medical History:  Diagnosis Date   Anxiety    Asthma    Atrial fibrillation (HCC)    C. difficile colitis    Heart murmur    Hypercholesteremia    Osteoporosis    Pseudomembranous colitis    Past Surgical History:  Procedure Laterality Date   ABDOMINAL HYSTERECTOMY     NASAL SINUS SURGERY     Family History  Problem Relation Age of Onset   Heart attack Mother    Heart disease Mother    Cancer Mother        breast   Hypertension Mother    Breast cancer Mother 39   Cancer Father        skin and prostate   Cancer Sister        lymphoma   Diabetes Sister    Stroke Sister  Dementia Sister    Diabetes Brother    Dementia Sister    COPD Neg Hx    Social History   Socioeconomic History   Marital status: Married    Spouse name: Jeneen Rinks   Number of children: 2   Years of education: some college   Highest education level: 12th grade  Occupational History   Occupation: Retired  Scientist, product/process development strain: Not hard at [employer] insecurity    Worry: Never true    Inability: Never true   Transportation needs    Medical: No    Non-medical: No  Tobacco Use   Smoking status: Never Smoker   Smokeless tobacco: Never Used   Tobacco comment: smoking cessation materials  not required  Substance and Sexual Activity   Alcohol use: No   Drug use: No   Sexual activity: Not Currently    Birth control/protection: Post-menopausal  Lifestyle   Physical activity    Days per week: 7 days    Minutes per session: 30 min   Stress: Only a little  Relationships   Social connections    Talks on phone: Patient refused    Gets together: Patient refused    Attends religious service: Patient refused    Active member of club or organization: Patient refused    Attends meetings of clubs or organizations: Patient refused    Relationship status: Married  Other Topics Concern   Not on file  Social History Narrative   Not on file    Outpatient Encounter Medications as of 03/06/2019  Medication Sig   albuterol (PROAIR HFA) 108 (90 Base) MCG/ACT inhaler Inhale 2 puffs into the lungs every 6 (six) hours as needed for wheezing or shortness of breath.   atorvastatin (LIPITOR) 20 MG tablet TAKE 1 TABLET BY MOUTH AT  BEDTIME   cholecalciferol (VITAMIN D) 1000 UNITS tablet Take 1,000 Units by mouth daily.   diphenhydramine-acetaminophen (TYLENOL PM) 25-500 MG TABS tablet Take 1 tablet by mouth at bedtime as needed.   metoprolol succinate (TOPROL-XL) 25 MG 24 hr tablet Take 1 tablet by mouth daily.   Multiple Vitamins-Minerals (MULTIVITAMIN WOMEN 50+ PO)    rivaroxaban (XARELTO) 20 MG TABS tablet Take 20 mg by mouth daily with supper. Reported on 07/27/2015   sertraline (ZOLOFT) 50 MG tablet Take 1 tablet (50 mg total) by mouth daily.   ADVAIR DISKUS 250-50 MCG/DOSE AEPB Inhale 1 puff into the lungs 2 (two) times daily. *Brand name medically necessary, dispense as written for ADVAIR* (Patient not taking: Reported on 01/13/2019)   fluticasone (FLONASE) 50 MCG/ACT nasal spray    [DISCONTINUED] ALPRAZolam (XANAX) 0.25 MG tablet Take 0.25 mg by mouth at bedtime as needed.   [DISCONTINUED] Ascorbic Acid (VITAMIN C) 100 MG tablet Take 100 mg by mouth daily.    [DISCONTINUED] CARTIA XT 120 MG 24 hr capsule Take 120 mg by mouth daily.   [DISCONTINUED] diltiazem (CARDIZEM) 60 MG tablet Take 60 mg by mouth as needed. If heart starts racing   [DISCONTINUED] nitroGLYCERIN (NITROSTAT) 0.4 MG SL tablet Place 1 tablet under the tongue every 5 (five) minutes as needed. For chest pain   [DISCONTINUED] triamcinolone ointment (KENALOG) 0.1 % Apply 1 application topically as needed. Apply BID x 2 weeks, HS x 2 weeks, M-W-F x 2 weeks   No facility-administered encounter medications on file as of 03/06/2019.     Activities of Daily Living In your present state of health, do you have any difficulty  performing the following activities: 03/06/2019 01/13/2019  Hearing? N N  Comment declines hearing aids -  Vision? N N  Difficulty concentrating or making decisions? N N  Walking or climbing stairs? N N  Dressing or bathing? N N  Doing errands, shopping? N N  Preparing Food and eating ? N -  Using the Toilet? N -  In the past six months, have you accidently leaked urine? N -  Do you have problems with loss of bowel control? N -  Managing your Medications? N -  Managing your Finances? N -  Housekeeping or managing your Housekeeping? N -  Some recent data might be hidden    Patient Care Team: Danelle Berry, PA-C as PCP - General (Family Medicine)    Assessment:   This is a routine wellness examination for Alisha Hernandez.  Exercise Activities and Dietary recommendations Current Exercise Habits: Home exercise routine, Type of exercise: walking, Time (Minutes): 30, Frequency (Times/Week): 7, Weekly Exercise (Minutes/Week): 210, Intensity: Moderate, Exercise limited by: None identified  Goals     DIET - EAT MORE FRUITS AND VEGETABLES     Recommend to increase intake of fruits and vegetables and to decrease portion sizes by eating 3 small healthy meals and at least 2 healthy snacks per day.       Fall Risk Fall Risk  03/06/2019 03/06/2019 01/13/2019 06/30/2018  05/23/2018  Falls in the past year? 0 0  Number falls in past yr: 0 -  Comment - fell on rock in the garden - - -  Injury with Fall? 0 0 0 0 -  Risk for fall due to : History of fall(s) - - - -  Risk for fall due to: Comment - - - - -  Follow up Falls prevention discussed Falls prevention discussed - - -   FALL RISK PREVENTION PERTAINING TO THE HOME:  Any stairs in or around the home? Yes  If so, do they handrails? Yes   Home free of loose throw rugs in walkways, pet beds, electrical cords, etc? Yes  Adequate lighting in your home to reduce risk of falls? Yes   ASSISTIVE DEVICES UTILIZED TO PREVENT FALLS:  Life alert? No  Use of a cane, walker or w/c? No  Grab bars in the bathroom? No  Shower chair or bench in shower? Yes  Elevated toilet seat or a handicapped toilet? No   DME ORDERS:  DME order needed?  No   TIMED UP AND GO:  Was the test performed? No . Telephonic visit.   Education: Fall risk prevention has been discussed.  Intervention(s) required? No   Depression Screen PHQ 2/9 Scores 03/06/2019 01/13/2019 06/30/2018 05/23/2018  PHQ - 2 Score 0  PHQ- 9 Score 0     Cognitive Function - pt declined 6CIT for 2020 AWV     6CIT Screen 02/28/2018  What Year? 0 points  What month? 0 points  What time? 0 points  Count back from 20 2 points  Months in reverse 0 points  Repeat phrase 0 points  Total Score 2    Immunization History  Administered Date(s) Administered   Influenza, High Dose Seasonal PF 05/02/2018   Influenza, Seasonal, Injecte, Preservative Fre 04/08/2007, 03/15/2008, 04/22/2012   Influenza-Unspecified 03/07/2015, 03/31/2015, 04/27/2016, 03/28/2017   Pneumococcal Conjugate-13 04/09/2014, 10/04/2014   Pneumococcal Polysaccharide-23 11/06/2004, 04/22/2012   Td 11/06/2004    Qualifies for Shingles Vaccine? Yes . Due for Shingrix.  Education has been provided regarding the importance of this vaccine. Pt has been advised to  call insurance company to determine out of pocket expense. Advised may also receive vaccine at local pharmacy or Health Dept. Verbalized acceptance and understanding. Pt unsure about taking this vaccine due to her asthma and she has had shingles twice.   Tdap: Although this vaccine is not a covered service during a Wellness Exam, does the patient still wish to receive this vaccine today?  No .  Education has been provided regarding the importance of this vaccine. Advised may receive this vaccine at local pharmacy or Health Dept. Aware to provide a copy of the vaccination record if obtained from local pharmacy or Health Dept. Verbalized acceptance and understanding.  Flu Vaccine: Due for Flu vaccine. Does the patient want to receive this vaccine today?  No . Education has been provided regarding the importance of this vaccine but still declined. Advised may receive this vaccine at local pharmacy or Health Dept. Aware to provide a copy of the vaccination record if obtained from local pharmacy or Health Dept. Verbalized acceptance and understanding.  Pneumococcal Vaccine: Up to date   Screening Tests Health Maintenance  Topic Date Due   TETANUS/TDAP  11/07/2014   INFLUENZA VACCINE  01/03/2019   DEXA SCAN  07/01/2019 (Originally Sep 23, 1940)   MAMMOGRAM  05/16/2019   PNA vac Low Risk Adult  Completed    Cancer Screenings:  Colorectal Screening:  No longer required.   Mammogram: Completed 05/15/18. Repeat every year. Ordered today. Pt provided with contact information and advised to call to schedule appt.   Bone Density: Pt declines  Lung Cancer Screening: (Low Dose CT Chest recommended if Age 14-80 years, 30 pack-year currently smoking OR have quit w/in 15years.) does not qualify.   Additional Screening:  Hepatitis C Screening: no longer required  Vision Screening: Recommended annual ophthalmology exams for early detection of glaucoma and other disorders of the eye. Is the patient  up to date with their annual eye exam?  No  - postponed due to Covid Who is the provider or what is the name of the office in which the pt attends annual eye exams? [facility]  Dental Screening: Recommended annual dental exams for proper oral hygiene  Community Resource Referral:  CRR required this visit?  No       Plan:     I have personally reviewed and addressed the Medicare Annual Wellness questionnaire and have noted the following in the patients chart:  A. Medical and social history B. Use of alcohol, tobacco or illicit drugs  C. Current medications and supplements D. Functional ability and status E.  Nutritional status F.  Physical activity G. Advance directives H. List of other physicians I.  Hospitalizations, surgeries, and ER visits in previous 12 months J.  Vitals K. Screenings such as hearing and vision if needed, cognitive and depression L. Referrals and appointments   In addition, I have reviewed and discussed with patient certain preventive protocols, quality metrics, and best practice recommendations. A written personalized care plan for preventive services as well as general preventive health recommendations were provided to patient.   Signed,  Reather LittlerKasey Tarez Bowns, LPN Nurse Health Advisor   Nurse Notes: pt c/o horrible taste in her mouth that has been going on for the past 3 months. She has been talking with pulmonology [facility] and has also been seen by ENT to attempt to determine the sources of drainage and  mucous. Please contact patient if you would like to follow up. She plans to call once she discusses with Dr. Sandy Salaam.

## 2019-03-06 NOTE — Patient Instructions (Signed)
[name] , Thank you for taking time to come for your Medicare Wellness Visit. I appreciate your ongoing commitment to your health goals. Please review the following plan we discussed and let me know if I can assist you in the future.   Screening recommendations/referrals: Colonoscopy: no longer required Mammogram: done 05/15/18. Please call [phone] to schedule your mammogram.  Bone Density: postponed Recommended yearly ophthalmology/optometry visit for glaucoma screening and checkup Recommended yearly dental visit for hygiene and checkup  Vaccinations: Influenza vaccine: due Pneumococcal vaccine: done 10/04/14 Tdap vaccine: due - please contact us if you get a cut or scrape Shingles vaccine: Shingrix discussed. Please contact your pharmacy for coverage information.   Advanced directives: Please bring a copy of your health care power of attorney and living will to the office at your convenience.  Conditions/risks identified: Keep up the great work!  Next appointment: Please follow up in one year for your Medicare Annual Wellness visit.     Preventive Care 79 Years and Older, Female Preventive care refers to lifestyle choices and visits with your health care provider that can promote health and wellness. What does preventive care include?  A yearly physical exam. This is also called an annual well check.  Dental exams once or twice a year.  Routine eye exams. Ask your health care provider how often you should have your eyes checked.  Personal lifestyle choices, including:  Daily care of your teeth and gums.  Regular physical activity.  Eating a healthy diet.  Avoiding tobacco and drug use.  Limiting alcohol use.  Practicing safe sex.  Taking low-dose aspirin every day.  Taking vitamin and mineral supplements as recommended by your health care provider. What happens during an annual well check? The services and screenings done by your health care provider during  your annual well check will depend on your age, overall health, lifestyle risk factors, and family history of disease. Counseling  Your health care provider may ask you questions about your:  Alcohol use.  Tobacco use.  Drug use.  Emotional well-being.  Home and relationship well-being.  Sexual activity.  Eating habits.  History of falls.  Memory and ability to understand (cognition).  Work and work Statistician.  Reproductive health. Screening  You may have the following tests or measurements:  Height, weight, and BMI.  Blood pressure.  Lipid and cholesterol levels. These may be checked every 5 years, or more frequently if you are over 39 years old.  Skin check.  Lung cancer screening. You may have this screening every year starting at age 46 if you have a 30-pack-year history of smoking and currently smoke or have quit within the past 15 years.  Fecal occult blood test (FOBT) of the stool. You may have this test every year starting at age 29.  Flexible sigmoidoscopy or colonoscopy. You may have a sigmoidoscopy every 5 years or a colonoscopy every 10 years starting at age 73.  Hepatitis C blood test.  Hepatitis B blood test.  Sexually transmitted disease (STD) testing.  Diabetes screening. This is done by checking your blood sugar (glucose) after you have not eaten for a while (fasting). You may have this done every 1-3 years.  Bone density scan. This is done to screen for osteoporosis. You may have this done starting at age 89.  Mammogram. This may be done every 1-2 years. Talk to your health care provider about how often you should have regular mammograms. Talk with your health care provider about your test  results, treatment options, and if necessary, the need for more tests. Vaccines  Your health care provider may recommend certain vaccines, such as:  Influenza vaccine. This is recommended every year.  Tetanus, diphtheria, and acellular pertussis (Tdap,  Td) vaccine. You may need a Td booster every 10 years.  Zoster vaccine. You may need this after age 93.  Pneumococcal 13-valent conjugate (PCV13) vaccine. One dose is recommended after age 16.  Pneumococcal polysaccharide (PPSV23) vaccine. One dose is recommended after age 33. Talk to your health care provider about which screenings and vaccines you need and how often you need them. This information is not intended to replace advice given to you by your health care provider. Make sure you discuss any questions you have with your health care provider. Document Released: 06/17/2015 Document Revised: 02/08/2016 Document Reviewed: 03/22/2015 Elsevier Interactive Patient Education  2017 [place] Prevention in the Home Falls can cause injuries. They can happen to people of all ages. There are many things you can do to make your home safe and to help prevent falls. What can I do on the outside of my home?  Regularly fix the edges of walkways and driveways and fix any cracks.  Remove anything that might make you trip as you walk through a door, such as a raised step or threshold.  Trim any bushes or trees on the path to your home.  Use bright outdoor lighting.  Clear any walking paths of anything that might make someone trip, such as rocks or tools.  Regularly check to see if handrails are loose or broken. Make sure that both sides of any steps have handrails.  Any raised decks and porches should have guardrails on the edges.  Have any leaves, snow, or ice cleared regularly.  Use sand or salt on walking paths during winter.  Clean up any spills in your garage right away. This includes oil or grease spills. What can I do in the bathroom?  Use night lights.  Install grab bars by the toilet and in the tub and shower. Do not use towel bars as grab bars.  Use non-skid mats or decals in the tub or shower.  If you need to sit down in the shower, use a plastic, non-slip stool.   Keep the floor dry. Clean up any water that spills on the floor as soon as it happens.  Remove soap buildup in the tub or shower regularly.  Attach bath mats securely with double-sided non-slip rug tape.  Do not have throw rugs and other things on the floor that can make you trip. What can I do in the bedroom?  Use night lights.  Make sure that you have a light by your bed that is easy to reach.  Do not use any sheets or blankets that are too big for your bed. They should not hang down onto the floor.  Have a firm chair that has side arms. You can use this for support while you get dressed.  Do not have throw rugs and other things on the floor that can make you trip. What can I do in the kitchen?  Clean up any spills right away.  Avoid walking on wet floors.  Keep items that you use a lot in easy-to-reach places.  If you need to reach something above you, use a strong step stool that has a grab bar.  Keep electrical cords out of the way.  Do not use floor polish or wax that makes  floors slippery. If you must use wax, use non-skid floor wax.  Do not have throw rugs and other things on the floor that can make you trip. What can I do with my stairs?  Do not leave any items on the stairs.  Make sure that there are handrails on both sides of the stairs and use them. Fix handrails that are broken or loose. Make sure that handrails are as long as the stairways.  Check any carpeting to make sure that it is firmly attached to the stairs. Fix any carpet that is loose or worn.  Avoid having throw rugs at the top or bottom of the stairs. If you do have throw rugs, attach them to the floor with carpet tape.  Make sure that you have a light switch at the top of the stairs and the bottom of the stairs. If you do not have them, ask someone to add them for you. What else can I do to help prevent falls?  Wear shoes that:  Do not have high heels.  Have rubber bottoms.  Are  comfortable and fit you well.  Are closed at the toe. Do not wear sandals.  If you use a stepladder:  Make sure that it is fully opened. Do not climb a closed stepladder.  Make sure that both sides of the stepladder are locked into place.  Ask someone to hold it for you, if possible.  Clearly mark and make sure that you can see:  Any grab bars or handrails.  First and last steps.  Where the edge of each step is.  Use tools that help you move around (mobility aids) if they are needed. These include:  Canes.  Walkers.  Scooters.  Crutches.  Turn on the lights when you go into a dark area. Replace any light bulbs as soon as they burn out.  Set up your furniture so you have a clear path. Avoid moving your furniture around.  If any of your floors are uneven, fix them.  If there are any pets around you, be aware of where they are.  Review your medicines with your doctor. Some medicines can make you feel dizzy. This can increase your chance of falling. Ask your doctor what other things that you can do to help prevent falls. This information is not intended to replace advice given to you by your health care provider. Make sure you discuss any questions you have with your health care provider. Document Released: 03/17/2009 Document Revised: 10/27/2015 Document Reviewed: 06/25/2014 Elsevier Interactive Patient Education  2017 [employer].

## 2019-03-09 ENCOUNTER — Other Ambulatory Visit: Payer: Self-pay | Admitting: Otolaryngology

## 2019-03-09 DIAGNOSIS — R439 Unspecified disturbances of smell and taste: Secondary | ICD-10-CM

## 2019-03-09 DIAGNOSIS — J019 Acute sinusitis, unspecified: Secondary | ICD-10-CM

## 2019-03-18 ENCOUNTER — Other Ambulatory Visit: Payer: Self-pay

## 2019-03-18 ENCOUNTER — Ambulatory Visit
Admission: RE | Admit: 2019-03-18 | Discharge: 2019-03-18 | Disposition: A | Discharge status: Home / Self Care | Payer: Medicare Other | Source: Ambulatory Visit | Attending: Otolaryngology | Admitting: Otolaryngology

## 2019-03-18 DIAGNOSIS — R439 Unspecified disturbances of smell and taste: Secondary | ICD-10-CM | POA: Diagnosis present

## 2019-03-18 DIAGNOSIS — J019 Acute sinusitis, unspecified: Secondary | ICD-10-CM | POA: Diagnosis present

## 2019-03-18 LAB — POCT I-STAT CREATININE: Creatinine, Ser: 0.6 mg/dL (ref 0.44–1.00)

## 2019-03-18 MED ORDER — GADOBUTROL 1 MMOL/ML IV SOLN
5.0000 mL | Freq: Once | INTRAVENOUS | Status: AC | PRN
  Administered 2019-03-18: 5 mL via INTRAVENOUS

## 2019-04-04 ENCOUNTER — Other Ambulatory Visit: Payer: Self-pay | Admitting: Family Medicine

## 2019-04-09 ENCOUNTER — Other Ambulatory Visit: Payer: Self-pay

## 2019-04-09 ENCOUNTER — Inpatient Hospital Stay: Payer: Medicare Other | Attending: Oncology

## 2019-04-09 DIAGNOSIS — R634 Abnormal weight loss: Secondary | ICD-10-CM | POA: Insufficient documentation

## 2019-04-09 DIAGNOSIS — Z807 Family history of other malignant neoplasms of lymphoid, hematopoietic and related tissues: Secondary | ICD-10-CM | POA: Diagnosis not present

## 2019-04-09 DIAGNOSIS — J329 Chronic sinusitis, unspecified: Secondary | ICD-10-CM | POA: Diagnosis not present

## 2019-04-09 DIAGNOSIS — D72820 Lymphocytosis (symptomatic): Secondary | ICD-10-CM | POA: Diagnosis present

## 2019-04-09 DIAGNOSIS — Z803 Family history of malignant neoplasm of breast: Secondary | ICD-10-CM | POA: Diagnosis not present

## 2019-04-09 LAB — CBC WITH DIFFERENTIAL/PLATELET
Abs Immature Granulocytes: 0.03 10*3/uL (ref 0.00–0.07)
Basophils Absolute: 0 10*3/uL (ref 0.0–0.1)
Basophils Relative: 0 %
Eosinophils Absolute: 0.1 10*3/uL (ref 0.0–0.5)
Eosinophils Relative: 2 %
HCT: 37.9 % (ref 36.0–46.0)
Hemoglobin: 12.6 g/dL (ref 12.0–15.0)
Immature Granulocytes: 1 %
Lymphocytes Relative: 40 %
Lymphs Abs: 1.8 10*3/uL (ref 0.7–4.0)
MCH: 31.7 pg (ref 26.0–34.0)
MCHC: 33.2 g/dL (ref 30.0–36.0)
MCV: 95.2 fL (ref 80.0–100.0)
Monocytes Absolute: 0.5 10*3/uL (ref 0.1–1.0)
Monocytes Relative: 10 %
Neutro Abs: 2.2 10*3/uL (ref 1.7–7.7)
Neutrophils Relative %: 47 %
Platelets: 164 10*3/uL (ref 150–400)
RBC: 3.98 MIL/uL (ref 3.87–5.11)
RDW: 13.7 % (ref 11.5–15.5)
WBC: 4.5 10*3/uL (ref 4.0–10.5)
nRBC: 0 % (ref 0.0–0.2)

## 2019-04-10 NOTE — Progress Notes (Signed)
[place]  [phone] [phone]) [phone]  ID: Era Bumpers OB: 05/18/1941  MR#: [id number]  TMH#:[id number]  Patient Care Team: Delsa Grana, PA-C as PCP - General (Family Medicine)  CHIEF COMPLAINT: Monoclonal B-cell lymphocytosis of unknown significance.  INTERVAL HISTORY: Patient returns to clinic today for routine yearly evaluation and discussion of her laboratory results.  Patient states she has had a chronic sinus infection which is led to a change in taste and a 10 to 15 pound weight loss.  She reports evaluation by multiple subspecialists, with no resolution of her symptoms.  She otherwise feels well. She has no neurologic complaints.  She denies any fevers.  She denies any chest pain, shortness of breath, cough, or hemoptysis.  She denies any nausea, vomiting, constipation, or diarrhea. She has no urinary complaints.  Patient offers no further specific complaints today.  REVIEW OF SYSTEMS:   Review of Systems  Constitutional: Positive for weight loss. Negative for fever and malaise/fatigue.  Respiratory: Negative.  Negative for cough, hemoptysis and shortness of breath.   Cardiovascular: Negative.  Negative for chest pain and leg swelling.  Gastrointestinal: Negative.  Negative for abdominal pain.  Genitourinary: Negative.  Negative for dysuria.  Musculoskeletal: Negative.  Negative for back pain.  Skin: Negative.  Negative for rash.  Neurological: Negative.  Negative for focal weakness, weakness and headaches.  Endo/Heme/Allergies: Does not bruise/bleed easily.  Psychiatric/Behavioral: Negative.  The patient is not nervous/anxious.     As per HPI. Otherwise, a complete review of systems is negative.  PAST MEDICAL HISTORY: Past Medical History:  Diagnosis Date  . Anxiety   . Asthma   . Atrial fibrillation ([place])   . C. difficile colitis   . Heart murmur   . Hypercholesteremia   . Osteoporosis   . Pseudomembranous colitis     PAST  SURGICAL HISTORY: Past Surgical History:  Procedure Laterality Date  . ABDOMINAL HYSTERECTOMY    . NASAL SINUS SURGERY      FAMILY HISTORY Family History  Problem Relation Age of Onset  . Heart attack Mother   . Heart disease Mother   . Cancer Mother        breast  . Hypertension Mother   . Breast cancer Mother 51  . Cancer Father        skin and prostate  . Cancer Sister        lymphoma  . Diabetes Sister   . Stroke Sister   . Dementia Sister   . Diabetes Brother   . Dementia Sister   . COPD Neg Hx        ADVANCED DIRECTIVES:    HEALTH MAINTENANCE: Social History   Tobacco Use  . Smoking status: Never Smoker  . Smokeless tobacco: Never Used  . Tobacco comment: smoking cessation materials not required  Substance Use Topics  . Alcohol use: No  . Drug use: No     Colonoscopy:  PAP:  Bone density:  Lipid panel:  Allergies  Allergen Reactions  . Penicillin G Other (See Comments)    Pt. Can't remember the side effect, but has always not taken it.  . Cefdinir Nausea And Vomiting  . Levofloxacin Nausea And Vomiting    racing heart    Current Outpatient Medications  Medication Sig Dispense Refill  . atorvastatin (LIPITOR) 20 MG tablet TAKE 1 TABLET BY MOUTH AT  BEDTIME 90 tablet 1  . cholecalciferol (VITAMIN D) 1000 UNITS tablet Take 1,000 Units by mouth daily.    Marland Kitchen  diphenhydramine-acetaminophen (TYLENOL PM) 25-500 MG TABS tablet Take 1 tablet by mouth at bedtime as needed.    . metoprolol succinate (TOPROL-XL) 25 MG 24 hr tablet Take 1 tablet by mouth daily.    . Multiple Vitamins-Minerals (MULTIVITAMIN WOMEN 50+ PO)     . Multiple Vitamins-Minerals (PRESERVISION AREDS 2+MULTI VIT PO) Take by mouth.    . rivaroxaban (XARELTO) 20 MG TABS tablet Take 20 mg by mouth daily with supper. Reported on 07/27/2015    . sertraline (ZOLOFT) 50 MG tablet TAKE 1 TABLET BY MOUTH  DAILY 30 tablet 11  . ADVAIR DISKUS 250-50 MCG/DOSE AEPB Inhale 1 puff into the lungs 2 (two)  times daily. *Brand name medically necessary, dispense as written for ADVAIR* (Patient not taking: Reported on 01/13/2019) 180 each 3  . albuterol (PROAIR HFA) 108 (90 Base) MCG/ACT inhaler Inhale 2 puffs into the lungs every 6 (six) hours as needed for wheezing or shortness of breath. (Patient not taking: Reported on 04/16/2019) 1 Inhaler 0  . fluticasone (FLONASE) 50 MCG/ACT nasal spray      No current facility-administered medications for this visit.     OBJECTIVE: Vitals:   04/16/19 1148  BP: (!) 131/56  Pulse: 61  Resp: 16  Temp: 97.6 F (36.4 C)  SpO2: 99%     Body mass index is 20.88 kg/m.    ECOG FS:0 - Asymptomatic  General: Well-developed, well-nourished, no acute distress. Eyes: Pink conjunctiva, anicteric sclera. HEENT: Normocephalic, moist mucous membranes. Lungs: Clear to auscultation bilaterally. Heart: Regular rate and rhythm. No rubs, murmurs, or gallops. Abdomen: Soft, nontender, nondistended. No organomegaly noted, normoactive bowel sounds. Musculoskeletal: No edema, cyanosis, or clubbing. Neuro: Alert, answering all questions appropriately. Cranial nerves grossly intact. Skin: No rashes or petechiae noted. Psych: Normal affect.  LAB RESULTS:  Lab Results  Component Value Date   NA 138 01/13/2019   K 4.2 01/13/2019   CL 103 01/13/2019   CO2 30 01/13/2019   GLUCOSE 86 01/13/2019   BUN 15 01/13/2019   CREATININE 0.60 03/18/2019   CALCIUM 9.8 01/13/2019   PROT 7.4 01/13/2019   ALBUMIN 4.3 02/03/2017   AST 18 01/13/2019   ALT 12 01/13/2019   ALKPHOS 59 02/03/2017   BILITOT 0.5 01/13/2019   GFRNONAA 85 01/13/2019   GFRAA 99 01/13/2019    Lab Results  Component Value Date   WBC 4.5 04/09/2019   NEUTROABS 2.2 04/09/2019   HGB 12.6 04/09/2019   HCT 37.9 04/09/2019   MCV 95.2 04/09/2019   PLT 164 04/09/2019     STUDIES: Mr Jeri Cos UU Contrast  Result Date: 03/19/2019 CLINICAL DATA:  Disturbances of sensation of smell and taste, acute  sinusitis suspected EXAM: MRI HEAD WITHOUT AND WITH CONTRAST TECHNIQUE: Multiplanar, multiecho pulse sequences of the brain and surrounding structures were obtained without and with intravenous contrast. CONTRAST:  50m GADAVIST GADOBUTROL 1 MMOL/ML IV SOLN COMPARISON:  Head CT 10/19/2014.  Brain MRI report 08/13/2002 FINDINGS: Brain: No acute or subacute infarction, hemorrhage, hydrocephalus, extra-axial collection or mass lesion. Overall mild chronic small vessel ischemic type change mainly in the pons. Brain volume is normal Vascular: Normal flow voids Skull and upper cervical spine: Negative for marrow lesion. Asymmetric right facet spurring with right C3-4 facet ankylosis Sinuses/Orbits: Chronic left sphenoid sinusitis with extensive opacification, wall thickening, and a proteinaceous rounded collection in the lateral recess. This is chronic when compared to prior. There is new right sphenoid sinusitis with circumferential mucosal thickening. IMPRESSION: 1. Chronic left sphenoid sinusitis  with retention cyst or mucocele in the lateral recess. 2. Moderate right sphenoid sinusitis that is new from a 2016 comparison. 3. Chronic small vessel ischemia to a moderate degree in the pons. Otherwise unremarkable intracranial imaging. Electronically Signed   By: Monte Fantasia M.D.   On: 03/19/2019 06:12    ASSESSMENT: Monoclonal B-cell lymphocytosis of unknown significance.  PLAN:    1. Monoclonal B-cell lymphocytosis of unknown significance: Peripheral blood flow cytometry continues to report a small monoclonal B-cell population of approximately 1% of her cells.  Since 2015, this is ranged from 1 to 3%.  This population is CD5 negative, CD10 negative with a kappa light chain restriction. No other significant immunophenotypic abnormalities were detected.  The clinical significance of these abnormal cells is unclear.  No intervention is needed at this time.  Patient does not require bone marrow biopsy.  Return to  clinic in 1 year with repeat laboratory work and further evaluation. 2.  Chronic sinusitis/weight loss: Continue follow-up with primary care/ENT as indicated.  Patient expressed understanding and was in agreement with this plan. She also understands that She can call clinic at any time with any questions, concerns, or complaints.    Lloyd Huger, MD   04/17/2019 6:35 AM

## 2019-04-13 LAB — COMP PANEL: LEUKEMIA/LYMPHOMA: Immunophenotypic Profile: 1

## 2019-04-16 ENCOUNTER — Other Ambulatory Visit: Payer: Self-pay

## 2019-04-16 ENCOUNTER — Inpatient Hospital Stay: Payer: Medicare Other | Admitting: Oncology

## 2019-04-16 ENCOUNTER — Encounter: Payer: Self-pay | Admitting: Oncology

## 2019-04-16 VITALS — BP 131/56 | HR 61 | Temp 97.6°F | Resp 16 | Wt 125.5 lb

## 2019-04-16 DIAGNOSIS — D72820 Lymphocytosis (symptomatic): Secondary | ICD-10-CM | POA: Diagnosis not present

## 2019-04-16 NOTE — Progress Notes (Signed)
Patent is coming in for follow up, she is doing ok. She mentions have trouble with her appetite but saw her ENT and they are assisting with her sinus issues that are causing her taste to be off. Has been tested for ovid a month ago but was negative

## 2019-05-18 ENCOUNTER — Ambulatory Visit
Admission: RE | Admit: 2019-05-18 | Discharge: 2019-05-18 | Disposition: A | Discharge status: Home / Self Care | Payer: Medicare Other | Source: Ambulatory Visit | Attending: Family Medicine | Admitting: Family Medicine

## 2019-05-18 DIAGNOSIS — Z1231 Encounter for screening mammogram for malignant neoplasm of breast: Secondary | ICD-10-CM | POA: Insufficient documentation

## 2019-06-18 ENCOUNTER — Other Ambulatory Visit: Payer: Self-pay

## 2019-06-19 MED ORDER — ATORVASTATIN CALCIUM 20 MG PO TABS: 20.0000 mg | ORAL_TABLET | Freq: Every day | ORAL | 1 refills | Status: AC

## 2019-08-03 DEATH — deceased

## 2019-10-20 IMAGING — MG DIGITAL SCREENING BILATERAL MAMMOGRAM WITH TOMO AND CAD
8 series · 9 of 24 positions shown · non-contrast
Comparison: Previous exam(s).

CLINICAL DATA: Screening.

EXAM:
DIGITAL SCREENING BILATERAL MAMMOGRAM WITH TOMO AND CAD

[L MLO synth-2D]
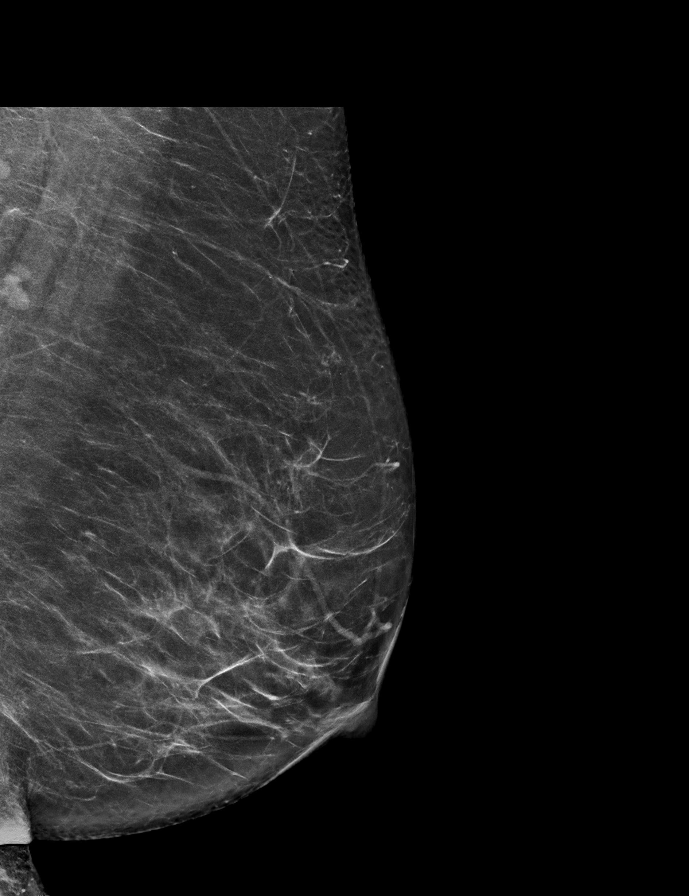

[R CC synth-2D]
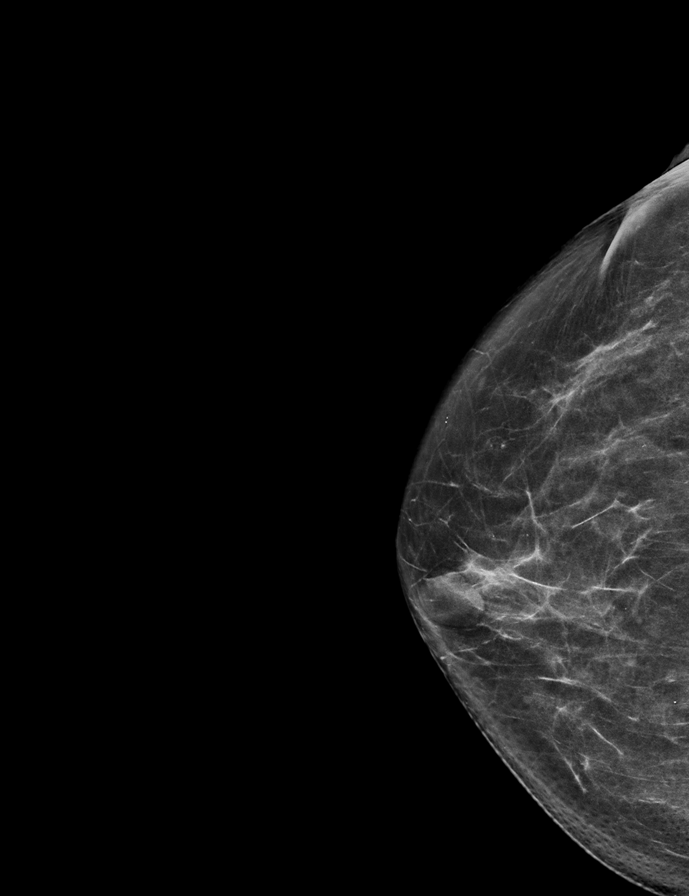

[R MLO synth-2D]
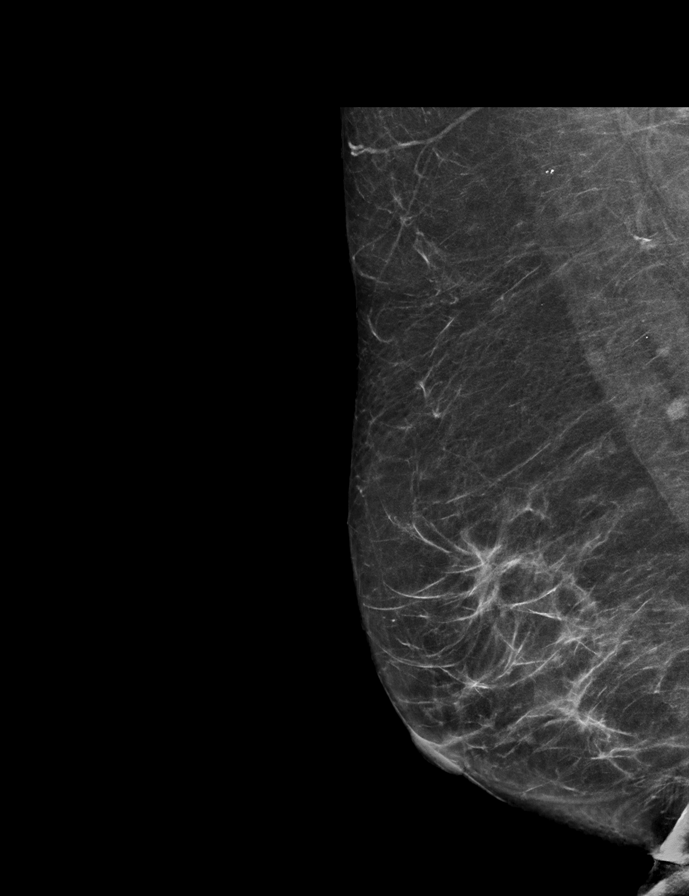

[L CC synth-2D]
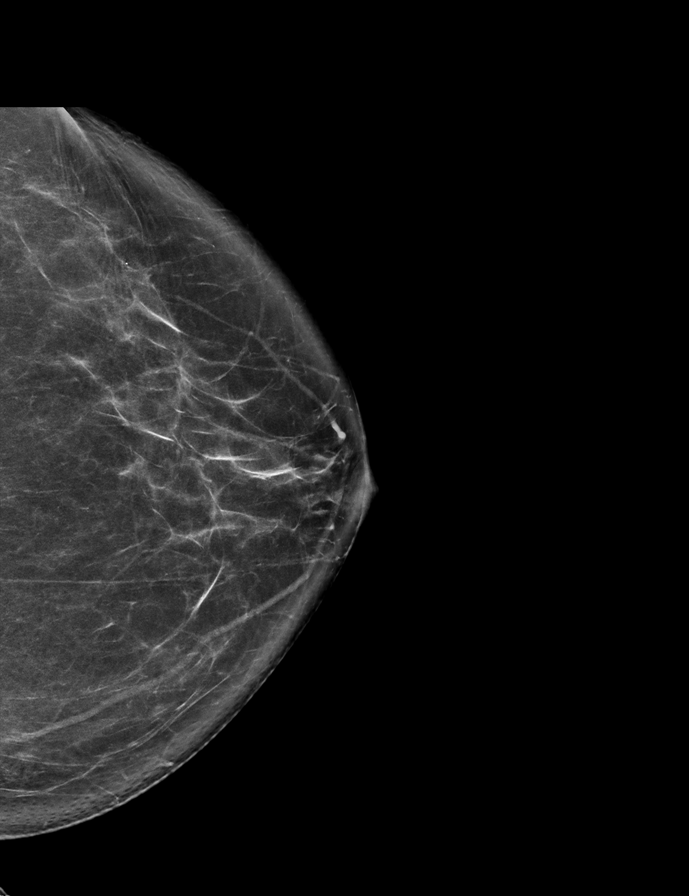

[R CC tomo · 2 of 65 frames shown]
[frame 21/65]
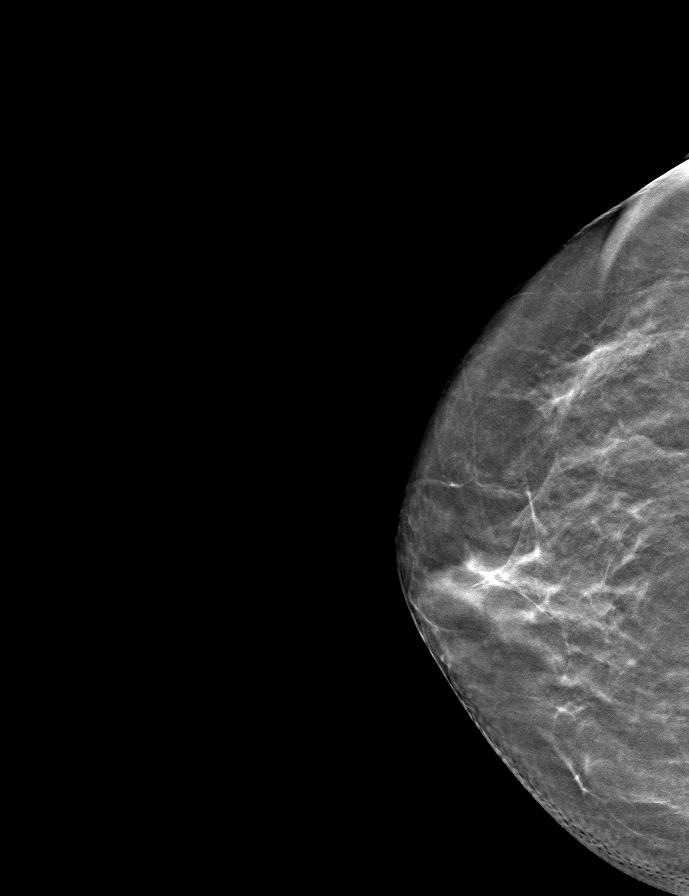
[frame 33/65]
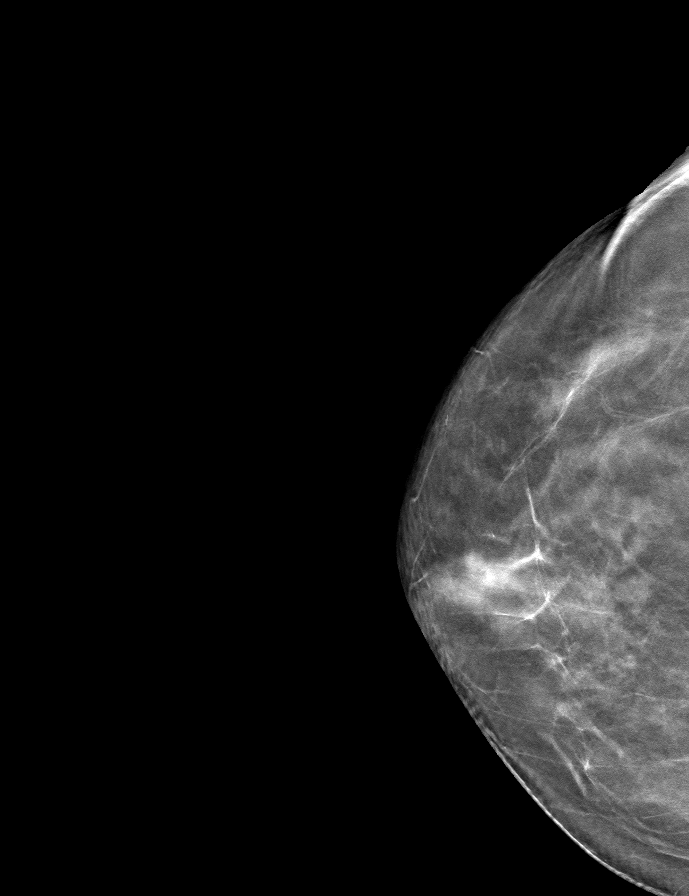

[L CC tomo · tomo slice 37/72.0]
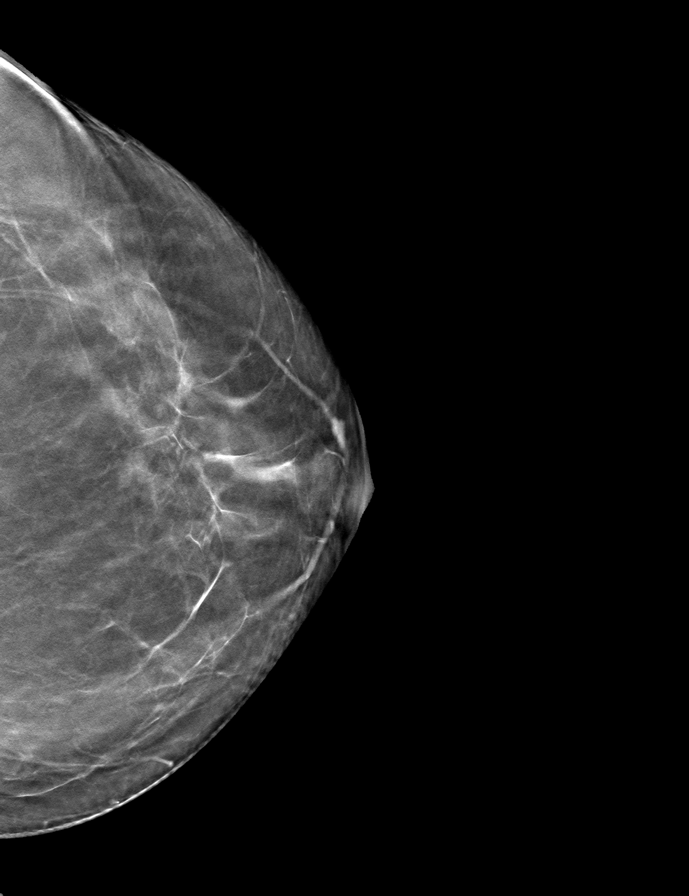

[R MLO tomo · tomo slice 34/67.0]
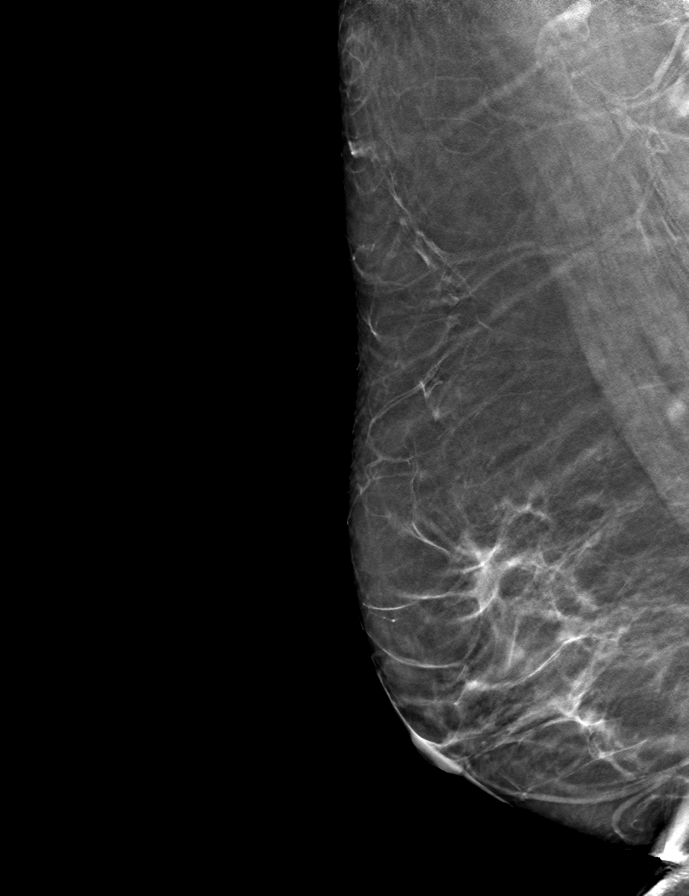

[L MLO tomo · tomo slice 35/68.0]
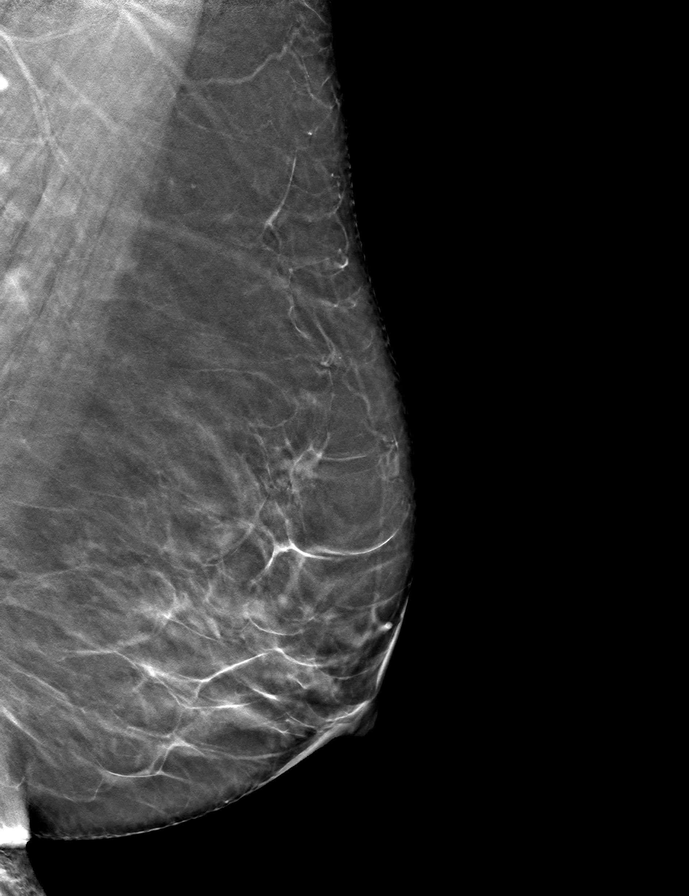

[9 of 24 positions shown; findings below may reference images not displayed]

ACR Breast Density Category b: There are scattered areas of
fibroglandular density.
FINDINGS: There are no findings suspicious for malignancy. Images were
processed with CAD.
IMPRESSION: No mammographic evidence of malignancy. A result letter of this
screening mammogram will be mailed directly to the patient.

RECOMMENDATION:
Screening mammogram in one year. (Code:[id number])

BI-RADS CATEGORY  1: Negative.

## 2020-03-08 ENCOUNTER — Ambulatory Visit: Payer: Self-pay

## 2020-04-14 ENCOUNTER — Other Ambulatory Visit: Payer: Medicare Other

## 2020-04-21 ENCOUNTER — Ambulatory Visit: Payer: Medicare Other | Admitting: Oncology
